# Patient Record
Sex: Male | Born: 1980
Health system: Southern US, Community
[De-identification: ages and names within clinical notes are randomized; demographics above are authoritative.]

---

## 2015-11-24 DIAGNOSIS — N509 Disorder of male genital organs, unspecified: Secondary | ICD-10-CM | POA: Diagnosis not present

## 2015-11-24 DIAGNOSIS — Z6825 Body mass index (BMI) 25.0-25.9, adult: Secondary | ICD-10-CM | POA: Diagnosis not present

## 2015-11-30 DIAGNOSIS — N448 Other noninflammatory disorders of the testis: Secondary | ICD-10-CM | POA: Diagnosis not present

## 2016-01-12 DIAGNOSIS — N448 Other noninflammatory disorders of the testis: Secondary | ICD-10-CM | POA: Diagnosis not present

## 2016-03-31 DIAGNOSIS — H109 Unspecified conjunctivitis: Secondary | ICD-10-CM | POA: Diagnosis not present

## 2016-03-31 DIAGNOSIS — H579 Unspecified disorder of eye and adnexa: Secondary | ICD-10-CM | POA: Diagnosis not present

## 2016-10-04 ENCOUNTER — Encounter (INDEPENDENT_AMBULATORY_CARE_PROVIDER_SITE_OTHER): Payer: Self-pay | Admitting: *Deleted

## 2016-10-04 VITALS — BP 131/77 | HR 69 | Temp 98.4°F | Ht 73.0 in | Wt 199.5 lb

## 2016-10-04 DIAGNOSIS — Z006 Encounter for examination for normal comparison and control in clinical research program: Secondary | ICD-10-CM

## 2016-10-04 LAB — COMPREHENSIVE METABOLIC PANEL
ALBUMIN: 3.8 g/dL (ref 3.6–5.1)
ALK PHOS: 43 U/L (ref 40–115)
ALT: 21 U/L (ref 9–46)
AST: 26 U/L (ref 10–40)
BILIRUBIN TOTAL: 0.7 mg/dL (ref 0.2–1.2)
BUN: 15 mg/dL (ref 7–25)
CALCIUM: 9.1 mg/dL (ref 8.6–10.3)
CO2: 24 mmol/L (ref 20–31)
CREATININE: 1.08 mg/dL (ref 0.60–1.35)
Chloride: 107 mmol/L (ref 98–110)
Glucose, Bld: 75 mg/dL (ref 65–99)
Potassium: 4.6 mmol/L (ref 3.5–5.3)
Sodium: 141 mmol/L (ref 135–146)
TOTAL PROTEIN: 6.4 g/dL (ref 6.1–8.1)

## 2016-10-04 LAB — CBC WITH DIFFERENTIAL/PLATELET
Basophils Absolute: 61 cells/uL (ref 0–200)
Basophils Relative: 1 %
EOS PCT: 1 %
Eosinophils Absolute: 61 cells/uL (ref 15–500)
HEMATOCRIT: 45.8 % (ref 38.5–50.0)
HEMOGLOBIN: 15.6 g/dL (ref 13.2–17.1)
LYMPHS ABS: 2257 {cells}/uL (ref 850–3900)
Lymphocytes Relative: 37 %
MCH: 31 pg (ref 27.0–33.0)
MCHC: 34.1 g/dL (ref 32.0–36.0)
MCV: 91.1 fL (ref 80.0–100.0)
MONO ABS: 427 {cells}/uL (ref 200–950)
MPV: 12.4 fL (ref 7.5–12.5)
Monocytes Relative: 7 %
NEUTROS ABS: 3294 {cells}/uL (ref 1500–7800)
Neutrophils Relative %: 54 %
Platelets: 188 10*3/uL (ref 140–400)
RBC: 5.03 MIL/uL (ref 4.20–5.80)
RDW: 14.1 % (ref 11.0–15.0)
WBC: 6.1 10*3/uL (ref 3.8–10.8)

## 2016-10-04 NOTE — Progress Notes (Signed)
   Subjective:    Patient ID: Michael Duncan, male    DOB: 05-15-1981, 36 y.o.   MRN: [id number]  HPI Here for PE for entry into HPTN study.   History reviewed.    Review of Systems     Objective:   Physical Exam  Constitutional: He appears well-developed and well-nourished. No distress.  HENT:  Mouth/Throat: No oropharyngeal exudate.  Eyes: No scleral icterus.  Cardiovascular: Normal rate, regular rhythm and normal heart sounds.   No murmur heard. Pulmonary/Chest: Effort normal and breath sounds normal. No respiratory distress.  Abdominal: Soft. Bowel sounds are normal. He exhibits no distension. There is no tenderness.  Musculoskeletal: He exhibits no edema.  Lymphadenopathy:    He has no cervical adenopathy.  Neurological: He is alert.  Skin: No rash noted.  Psychiatric: He has a normal mood and affect.          Assessment & Plan:

## 2016-10-05 LAB — HEPATITIS C ANTIBODY: HCV AB: NEGATIVE

## 2016-10-05 LAB — HEPATITIS B SURFACE ANTIGEN: HEP B S AG: NEGATIVE

## 2016-10-05 LAB — HIV ANTIBODY (ROUTINE TESTING W REFLEX): HIV: NONREACTIVE

## 2016-10-06 ENCOUNTER — Encounter: Payer: Self-pay | Admitting: *Deleted

## 2016-10-06 LAB — HIV-1 RNA QUANT-NO REFLEX-BLD
HIV 1 RNA Quant: <20 copies/mL
HIV-1 RNA QUANT, LOG: NOT DETECTED {Log_copies}/mL

## 2016-10-06 NOTE — Progress Notes (Signed)
Study: A Phase 2b/3 Double Blind Safety and Efficacy Study of Injectable Cabotegravir compared to Daily Oral Tenofovir Disoproxil Fumarate/Emtricitabine (TDF/FTC), For Pre-Exposure Prophylaxis in HIV-Uninfected Cisgender Men and Transgender Women who have sex with Men.  Medication: Investigational Injectable Cabotegravir/placebo compared to Truvada/placebo. Duration: Around 4 years.  Michael Duncan is here for HPTN screening visit. After verifying the correct version I explained/reviewed the informed consent in the language that he understood. Risk, benefits, responsibilities, and other options were reviewed. I answered his questions. Comprehension was assessed. He was given adequate time to consider his options. He verbalized understanding and signed the consent witnessed by me. I then gave him a copy of the consent. SexPro = 15. He has had 60 male at birth sexual partners in the past 6 mos and wears condoms 95% of the time.  He is mainly the insertive partner but is receptive at times. In January 2018 he tested + and treated for Gonorrhea at [facility].  HIV counseling was given including description of the testing and how it is done; explained HIV and how it is spread and ways to prevent it; Discussed the meaning of the possible test results and what impact the test results may have on the participant. PTID assigned. Blood drawn and HIV rapid confirmed to be non-reactive. Medical history, medications, bleeding history, and signs/symptoms were reviewed. ECG and vitals were obtained. QTcB = 409 ms.  Complete PE performed. He received $50 gift card for screening visit. If deemed eligible and he is willing to participant in the study then anticipated entry visit is scheduled for 10/12/2016.

## 2016-10-12 ENCOUNTER — Encounter (INDEPENDENT_AMBULATORY_CARE_PROVIDER_SITE_OTHER): Payer: Self-pay | Admitting: *Deleted

## 2016-10-12 VITALS — BP 119/79 | HR 70 | Temp 98.3°F | Wt 199.5 lb

## 2016-10-12 DIAGNOSIS — Z006 Encounter for examination for normal comparison and control in clinical research program: Secondary | ICD-10-CM

## 2016-10-12 LAB — URINALYSIS
Bilirubin Urine: NEGATIVE
GLUCOSE, UA: NEGATIVE
HGB URINE DIPSTICK: NEGATIVE
KETONES UR: NEGATIVE
Leukocytes, UA: NEGATIVE
NITRITE: NEGATIVE
PH: 6.5 (ref 5.0–8.0)
PROTEIN: NEGATIVE
Specific Gravity, Urine: 1.016 (ref 1.001–1.035)

## 2016-10-12 LAB — COMPREHENSIVE METABOLIC PANEL
ALBUMIN: 3.9 g/dL (ref 3.6–5.1)
ALT: 22 U/L (ref 9–46)
AST: 25 U/L (ref 10–40)
Alkaline Phosphatase: 37 U/L — ABNORMAL LOW (ref 40–115)
BUN: 15 mg/dL (ref 7–25)
CHLORIDE: 107 mmol/L (ref 98–110)
CO2: 25 mmol/L (ref 20–31)
CREATININE: 1.04 mg/dL (ref 0.60–1.35)
Calcium: 9 mg/dL (ref 8.6–10.3)
GLUCOSE: 96 mg/dL (ref 65–99)
Potassium: 4.4 mmol/L (ref 3.5–5.3)
SODIUM: 138 mmol/L (ref 135–146)
Total Bilirubin: 0.7 mg/dL (ref 0.2–1.2)
Total Protein: 6.4 g/dL (ref 6.1–8.1)

## 2016-10-12 LAB — CBC WITH DIFFERENTIAL/PLATELET
BASOS PCT: 1 %
Basophils Absolute: 60 cells/uL (ref 0–200)
EOS PCT: 2 %
Eosinophils Absolute: 120 cells/uL (ref 15–500)
HEMATOCRIT: 45.2 % (ref 38.5–50.0)
Hemoglobin: 15.7 g/dL (ref 13.2–17.1)
LYMPHS PCT: 42 %
Lymphs Abs: 2520 cells/uL (ref 850–3900)
MCH: 31.3 pg (ref 27.0–33.0)
MCHC: 34.7 g/dL (ref 32.0–36.0)
MCV: 90.2 fL (ref 80.0–100.0)
MONOS PCT: 6 %
MPV: 12.5 fL (ref 7.5–12.5)
Monocytes Absolute: 360 cells/uL (ref 200–950)
NEUTROS PCT: 49 %
Neutro Abs: 2940 cells/uL (ref 1500–7800)
PLATELETS: 176 10*3/uL (ref 140–400)
RBC: 5.01 MIL/uL (ref 4.20–5.80)
RDW: 13.7 % (ref 11.0–15.0)
WBC: 6 10*3/uL (ref 3.8–10.8)

## 2016-10-12 LAB — HEPATITIS B CORE ANTIBODY, TOTAL: HEP B C TOTAL AB: NONREACTIVE

## 2016-10-12 LAB — HEPATITIS B SURFACE ANTIBODY,QUALITATIVE: HEP B S AB: POSITIVE — AB

## 2016-10-12 LAB — LIPID PANEL
Cholesterol: 148 mg/dL (ref <200)
HDL: 48 mg/dL (ref >40)
LDL CALC: 87 mg/dL (ref <100)
Total CHOL/HDL Ratio: 3.1 Ratio (ref <5.0)
Triglycerides: 64 mg/dL (ref <150)
VLDL: 13 mg/dL (ref <30)

## 2016-10-12 LAB — PHOSPHORUS: PHOSPHORUS: 3.1 mg/dL (ref 2.5–4.5)

## 2016-10-12 LAB — AMYLASE: AMYLASE: 54 U/L (ref 0–105)

## 2016-10-12 LAB — LIPASE: Lipase: 19 U/L (ref 7–60)

## 2016-10-12 LAB — CK: Total CK: 136 U/L (ref 7–232)

## 2016-10-13 LAB — HIV ANTIBODY (ROUTINE TESTING W REFLEX): HIV: NONREACTIVE

## 2016-10-13 LAB — GC/CHLAMYDIA PROBE AMP
CT PROBE, AMP APTIMA: NOT DETECTED
GC Probe RNA: NOT DETECTED

## 2016-10-13 LAB — RPR

## 2016-10-13 NOTE — Progress Notes (Signed)
Study: A Phase 2b/3 Double Blind Safety and Efficacy Study of Injectable Cabotegravir compared to Daily Oral Tenofovir Disoproxil Fumarate/Emtricitabine (TDF/FTC), For Pre-Exposure Prophylaxis in HIV-Uninfected Cisgender Men and Transgender Women who have sex with Men.  Medication: Investigational Injectable Cabotegravir/placebo compared to Truvada/placebo. Duration: Around 4 years  Michael Duncan is here for entry visit. After confirming his willingness to enroll onto study I drew his blood. HIV rapid confirmed to be non-reactive. He denies any new issues since last study visit. Questionnaires completed. We discussed administration of oral study products, potential side effects, and gave him my contact information should he have any questions/concerns. He verbalized understanding. Oral study product dispensed. He will return in 2 weeks for a safety check and adherence assessment.

## 2016-10-15 LAB — CT/NG RNA, TMA RECTAL
CHLAMYDIA TRACHOMATIS RNA: NOT DETECTED
Neisseria Gonorrhoeae RNA: NOT DETECTED

## 2016-10-26 ENCOUNTER — Encounter (INDEPENDENT_AMBULATORY_CARE_PROVIDER_SITE_OTHER): Payer: Self-pay | Admitting: *Deleted

## 2016-10-26 VITALS — BP 113/70 | HR 66 | Temp 98.4°F | Wt 204.5 lb

## 2016-10-26 DIAGNOSIS — Z006 Encounter for examination for normal comparison and control in clinical research program: Secondary | ICD-10-CM

## 2016-10-26 LAB — COMPREHENSIVE METABOLIC PANEL
ALT: 29 U/L (ref 9–46)
AST: 31 U/L (ref 10–40)
Albumin: 3.9 g/dL (ref 3.6–5.1)
Alkaline Phosphatase: 38 U/L — ABNORMAL LOW (ref 40–115)
BILIRUBIN TOTAL: 0.8 mg/dL (ref 0.2–1.2)
BUN: 14 mg/dL (ref 7–25)
CO2: 27 mmol/L (ref 20–31)
CREATININE: 1.01 mg/dL (ref 0.60–1.35)
Calcium: 8.7 mg/dL (ref 8.6–10.3)
Chloride: 107 mmol/L (ref 98–110)
GLUCOSE: 100 mg/dL — AB (ref 65–99)
Potassium: 4.3 mmol/L (ref 3.5–5.3)
SODIUM: 140 mmol/L (ref 135–146)
Total Protein: 6.4 g/dL (ref 6.1–8.1)

## 2016-10-26 LAB — CBC WITH DIFFERENTIAL/PLATELET
BASOS ABS: 63 {cells}/uL (ref 0–200)
BASOS PCT: 1 %
EOS ABS: 126 {cells}/uL (ref 15–500)
Eosinophils Relative: 2 %
HEMATOCRIT: 42.2 % (ref 38.5–50.0)
HEMOGLOBIN: 14.8 g/dL (ref 13.2–17.1)
LYMPHS ABS: 2142 {cells}/uL (ref 850–3900)
Lymphocytes Relative: 34 %
MCH: 31.3 pg (ref 27.0–33.0)
MCHC: 35.1 g/dL (ref 32.0–36.0)
MCV: 89.2 fL (ref 80.0–100.0)
MONO ABS: 378 {cells}/uL (ref 200–950)
MPV: 11.9 fL (ref 7.5–12.5)
Monocytes Relative: 6 %
NEUTROS ABS: 3591 {cells}/uL (ref 1500–7800)
Neutrophils Relative %: 57 %
PLATELETS: 167 10*3/uL (ref 140–400)
RBC: 4.73 MIL/uL (ref 4.20–5.80)
RDW: 13.6 % (ref 11.0–15.0)
WBC: 6.3 10*3/uL (ref 3.8–10.8)

## 2016-10-26 LAB — AMYLASE: Amylase: 52 U/L (ref 0–105)

## 2016-10-26 LAB — LIPASE: Lipase: 20 U/L (ref 7–60)

## 2016-10-26 LAB — CK: Total CK: 201 U/L (ref 7–232)

## 2016-10-26 LAB — PHOSPHORUS: Phosphorus: 3.4 mg/dL (ref 2.5–4.5)

## 2016-10-26 NOTE — Progress Notes (Signed)
Study: A Phase 2b/3 Double Blind Safety and Efficacy Study of Injectable Cabotegravir compared to Daily Oral Tenofovir Disoproxil Fumarate/Emtricitabine (TDF/FTC), For Pre-Exposure Prophylaxis in HIV-Uninfected Cisgender Men and Transgender Women who have sex with Men.  Medication: Investigational Injectable Cabotegravir/placebo compared to Truvada/placebo. Duration: Around 4 years.  Michael RuizJohn is here for week 2 visit. Verbalized excellent adherence with his study medication. Denies any missed doses. No new complaints or concerns verbalized. Next visit scheduled for 5/15 @ 9:30am.

## 2016-10-27 LAB — HIV ANTIBODY (ROUTINE TESTING W REFLEX): HIV 1&2 Ab, 4th Generation: NONREACTIVE

## 2016-11-07 ENCOUNTER — Encounter (INDEPENDENT_AMBULATORY_CARE_PROVIDER_SITE_OTHER): Payer: Self-pay | Admitting: *Deleted

## 2016-11-07 VITALS — BP 108/72 | HR 61 | Temp 98.1°F | Wt 198.2 lb

## 2016-11-07 DIAGNOSIS — Z006 Encounter for examination for normal comparison and control in clinical research program: Secondary | ICD-10-CM

## 2016-11-07 LAB — CBC WITH DIFFERENTIAL/PLATELET
BASOS ABS: 0 {cells}/uL (ref 0–200)
Basophils Relative: 0 %
EOS ABS: 116 {cells}/uL (ref 15–500)
Eosinophils Relative: 2 %
HCT: 41.7 % (ref 38.5–50.0)
HEMOGLOBIN: 14.6 g/dL (ref 13.2–17.1)
Lymphocytes Relative: 40 %
Lymphs Abs: 2320 cells/uL (ref 850–3900)
MCH: 31.5 pg (ref 27.0–33.0)
MCHC: 35 g/dL (ref 32.0–36.0)
MCV: 89.9 fL (ref 80.0–100.0)
MPV: 12.1 fL (ref 7.5–12.5)
Monocytes Absolute: 464 cells/uL (ref 200–950)
Monocytes Relative: 8 %
NEUTROS ABS: 2900 {cells}/uL (ref 1500–7800)
NEUTROS PCT: 50 %
Platelets: 174 10*3/uL (ref 140–400)
RBC: 4.64 MIL/uL (ref 4.20–5.80)
RDW: 14.3 % (ref 11.0–15.0)
WBC: 5.8 10*3/uL (ref 3.8–10.8)

## 2016-11-07 LAB — COMPREHENSIVE METABOLIC PANEL
ALBUMIN: 4 g/dL (ref 3.6–5.1)
ALT: 38 U/L (ref 9–46)
AST: 49 U/L — ABNORMAL HIGH (ref 10–40)
Alkaline Phosphatase: 39 U/L — ABNORMAL LOW (ref 40–115)
BUN: 15 mg/dL (ref 7–25)
CHLORIDE: 106 mmol/L (ref 98–110)
CO2: 26 mmol/L (ref 20–31)
Calcium: 9.2 mg/dL (ref 8.6–10.3)
Creat: 1.08 mg/dL (ref 0.60–1.35)
Glucose, Bld: 85 mg/dL (ref 65–99)
POTASSIUM: 4.8 mmol/L (ref 3.5–5.3)
Sodium: 139 mmol/L (ref 135–146)
TOTAL PROTEIN: 6.5 g/dL (ref 6.1–8.1)
Total Bilirubin: 1 mg/dL (ref 0.2–1.2)

## 2016-11-07 LAB — HIV ANTIBODY (ROUTINE TESTING W REFLEX): HIV: NONREACTIVE

## 2016-11-07 LAB — LIPASE: Lipase: 23 U/L (ref 7–60)

## 2016-11-07 LAB — PHOSPHORUS: PHOSPHORUS: 3.6 mg/dL (ref 2.5–4.5)

## 2016-11-07 LAB — AMYLASE: AMYLASE: 47 U/L (ref 21–101)

## 2016-11-07 LAB — CK: Total CK: 760 U/L — ABNORMAL HIGH (ref 44–196)

## 2016-11-07 NOTE — Progress Notes (Signed)
Michael RuizJohn is here for his week 4 visit for HPTN. His adherence is at 100% and denies any problems with taking the meds or any side effects. We discussed what will happen at the next visit (injection and questionnaires). He will return next Thursday for week 5.

## 2016-11-16 ENCOUNTER — Encounter (INDEPENDENT_AMBULATORY_CARE_PROVIDER_SITE_OTHER): Payer: Self-pay | Admitting: *Deleted

## 2016-11-16 VITALS — BP 128/79 | HR 84 | Temp 98.4°F | Wt 196.0 lb

## 2016-11-16 DIAGNOSIS — Z006 Encounter for examination for normal comparison and control in clinical research program: Secondary | ICD-10-CM

## 2016-11-16 LAB — HIV ANTIBODY (ROUTINE TESTING W REFLEX): HIV: NONREACTIVE

## 2016-11-16 NOTE — Progress Notes (Signed)
Study: A Phase 2b/3 Double Blind Safety and Efficacy Study of Injectable Cabotegravir compared to Daily Oral Tenofovir Disoproxil Fumarate/Emtricitabine (TDF/FTC), For Pre-Exposure Prophylaxis in HIV-Uninfected Cisgender Men and Transgender Women who have sex with Men.  Medication: Investigational Injectable Cabotegravir/placebo compared to Truvada/placebo. Duration: Around 4 years.  Michael RuizJohn is here for week 5. He denies any changes since last study visit. He returned #24 pills of each oral study product. Last time he took oral study products was this morning at 8:30am. Blood drawn and HIV rapid confirmed non-reactive. He received injection in right gluteal muscle with no problems. Oral study product dispensed. He received $50 giftcard for visit. Next appointment schedule for 5/31.

## 2016-11-23 ENCOUNTER — Encounter (INDEPENDENT_AMBULATORY_CARE_PROVIDER_SITE_OTHER): Payer: Self-pay | Admitting: *Deleted

## 2016-11-23 VITALS — BP 119/69 | HR 67 | Temp 99.4°F | Wt 194.2 lb

## 2016-11-23 DIAGNOSIS — Z006 Encounter for examination for normal comparison and control in clinical research program: Secondary | ICD-10-CM

## 2016-11-23 LAB — CBC WITH DIFFERENTIAL/PLATELET
Basophils Absolute: 68 cells/uL (ref 0–200)
Basophils Relative: 1 %
EOS PCT: 1 %
Eosinophils Absolute: 68 cells/uL (ref 15–500)
HCT: 40.1 % (ref 38.5–50.0)
Hemoglobin: 13.8 g/dL (ref 13.2–17.1)
Lymphocytes Relative: 31 %
Lymphs Abs: 2108 cells/uL (ref 850–3900)
MCH: 31.4 pg (ref 27.0–33.0)
MCHC: 34.4 g/dL (ref 32.0–36.0)
MCV: 91.3 fL (ref 80.0–100.0)
MONOS PCT: 8 %
MPV: 11.9 fL (ref 7.5–12.5)
Monocytes Absolute: 544 cells/uL (ref 200–950)
NEUTROS ABS: 4012 {cells}/uL (ref 1500–7800)
Neutrophils Relative %: 59 %
PLATELETS: 185 10*3/uL (ref 140–400)
RBC: 4.39 MIL/uL (ref 4.20–5.80)
RDW: 15 % (ref 11.0–15.0)
WBC: 6.8 10*3/uL (ref 3.8–10.8)

## 2016-11-23 LAB — COMPREHENSIVE METABOLIC PANEL
ALK PHOS: 53 U/L (ref 40–115)
ALT: 33 U/L (ref 9–46)
AST: 47 U/L — AB (ref 10–40)
Albumin: 3.8 g/dL (ref 3.6–5.1)
BUN: 25 mg/dL (ref 7–25)
CALCIUM: 8.8 mg/dL (ref 8.6–10.3)
CO2: 23 mmol/L (ref 20–31)
Chloride: 107 mmol/L (ref 98–110)
Creat: 1.1 mg/dL (ref 0.60–1.35)
GLUCOSE: 86 mg/dL (ref 65–99)
POTASSIUM: 4.4 mmol/L (ref 3.5–5.3)
Sodium: 139 mmol/L (ref 135–146)
Total Bilirubin: 0.8 mg/dL (ref 0.2–1.2)
Total Protein: 6.2 g/dL (ref 6.1–8.1)

## 2016-11-23 LAB — CK: CK TOTAL: 776 U/L — AB (ref 44–196)

## 2016-11-23 LAB — AMYLASE: AMYLASE: 42 U/L (ref 21–101)

## 2016-11-23 LAB — PHOSPHORUS: PHOSPHORUS: 3.5 mg/dL (ref 2.5–4.5)

## 2016-11-23 LAB — HIV ANTIBODY (ROUTINE TESTING W REFLEX): HIV 1&2 Ab, 4th Generation: NONREACTIVE

## 2016-11-23 LAB — LIPASE: Lipase: 19 U/L (ref 7–60)

## 2016-11-23 NOTE — Progress Notes (Signed)
Study: A Phase 2b/3 Double Blind Safety and Efficacy Study of Injectable Cabotegravir compared to Daily Oral Tenofovir Disoproxil Fumarate/Emtricitabine (TDF/FTC), For Pre-Exposure Prophylaxis in HIV-Uninfected Cisgender Men and Transgender Women who have sex with Men.  Medication: Investigational Injectable Cabotegravir/placebo compared to Truvada/placebo. Duration: Around 4 years.  Michael RuizJohn is here for week 6. He denies any injection site reaction and/or any other changes since last stud visit. Questionnaire completed. He was introduced to [employer]ridget Land(outreach coordinator). Blood drawn with no problems. Vitals stable. He received $50 gift card. Next appointment scheduled for Monday 6/18.

## 2016-12-11 ENCOUNTER — Encounter (INDEPENDENT_AMBULATORY_CARE_PROVIDER_SITE_OTHER): Payer: Self-pay | Admitting: *Deleted

## 2016-12-11 VITALS — BP 114/69 | HR 56 | Temp 98.0°F | Wt 191.5 lb

## 2016-12-11 DIAGNOSIS — Z006 Encounter for examination for normal comparison and control in clinical research program: Secondary | ICD-10-CM

## 2016-12-11 LAB — CBC WITH DIFFERENTIAL/PLATELET
Basophils Absolute: 48 {cells}/uL (ref 0–200)
Basophils Relative: 1 %
Eosinophils Absolute: 144 {cells}/uL (ref 15–500)
Eosinophils Relative: 3 %
HCT: 41 % (ref 38.5–50.0)
Hemoglobin: 13.9 g/dL (ref 13.2–17.1)
Lymphocytes Relative: 40 %
Lymphs Abs: 1920 {cells}/uL (ref 850–3900)
MCH: 31.9 pg (ref 27.0–33.0)
MCHC: 33.9 g/dL (ref 32.0–36.0)
MCV: 94 fL (ref 80.0–100.0)
MPV: 11.7 fL (ref 7.5–12.5)
Monocytes Absolute: 240 {cells}/uL (ref 200–950)
Monocytes Relative: 5 %
Neutro Abs: 2448 {cells}/uL (ref 1500–7800)
Neutrophils Relative %: 51 %
Platelets: 198 K/uL (ref 140–400)
RBC: 4.36 MIL/uL (ref 4.20–5.80)
RDW: 14.7 % (ref 11.0–15.0)
WBC: 4.8 K/uL (ref 3.8–10.8)

## 2016-12-11 LAB — COMPREHENSIVE METABOLIC PANEL
ALK PHOS: 55 U/L (ref 40–115)
ALT: 32 U/L (ref 9–46)
AST: 35 U/L (ref 10–40)
Albumin: 3.8 g/dL (ref 3.6–5.1)
BUN: 15 mg/dL (ref 7–25)
CALCIUM: 8.9 mg/dL (ref 8.6–10.3)
CO2: 24 mmol/L (ref 20–31)
Chloride: 107 mmol/L (ref 98–110)
Creat: 0.92 mg/dL (ref 0.60–1.35)
Glucose, Bld: 92 mg/dL (ref 65–99)
POTASSIUM: 4.7 mmol/L (ref 3.5–5.3)
Sodium: 139 mmol/L (ref 135–146)
TOTAL PROTEIN: 6.6 g/dL (ref 6.1–8.1)
Total Bilirubin: 0.6 mg/dL (ref 0.2–1.2)

## 2016-12-11 LAB — PHOSPHORUS: PHOSPHORUS: 2.8 mg/dL (ref 2.5–4.5)

## 2016-12-11 LAB — AMYLASE: AMYLASE: 55 U/L (ref 21–101)

## 2016-12-11 LAB — CK: CK TOTAL: 260 U/L — AB (ref 44–196)

## 2016-12-11 LAB — LIPASE: LIPASE: 27 U/L (ref 7–60)

## 2016-12-11 NOTE — Progress Notes (Signed)
Study: A Phase 2b/3 Double Blind Safety and Efficacy Study of Injectable Cabotegravir compared to Daily Oral Tenofovir Disoproxil Fumarate/Emtricitabine (TDF/FTC), For Pre-Exposure Prophylaxis in HIV-Uninfected Cisgender Men and Transgender Women who have sex with Men.  Medication: Investigational Injectable Cabotegravir/placebo compared to Truvada/placebo. Duration: Around 4 years.  Michael RuizJohn is here for week 9 visit. No new complaints or concerns verbalized. States excellent adherence with his oral study medication. Rapid HIV non-reactive. Cabotegravir/placebo injection given (L) gluteal muscle. Site unremarkable. Next visit scheduled for 6/26 @ 10:30am.

## 2016-12-11 NOTE — Progress Notes (Signed)
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 Ppt prefers email vs snail mail; confirmed email address. Ppt currently uses mychart and has the [employer] app on cell phone. Ppt receives multiple appt reminders.

## 2016-12-12 LAB — HIV ANTIBODY (ROUTINE TESTING W REFLEX): HIV: NONREACTIVE

## 2016-12-19 ENCOUNTER — Encounter (INDEPENDENT_AMBULATORY_CARE_PROVIDER_SITE_OTHER): Payer: Self-pay | Admitting: *Deleted

## 2016-12-19 VITALS — BP 114/69 | HR 62 | Temp 98.5°F | Wt 190.8 lb

## 2016-12-19 DIAGNOSIS — Z006 Encounter for examination for normal comparison and control in clinical research program: Secondary | ICD-10-CM

## 2016-12-19 LAB — COMPREHENSIVE METABOLIC PANEL
ALBUMIN: 3.6 g/dL (ref 3.6–5.1)
ALT: 28 U/L (ref 9–46)
AST: 35 U/L (ref 10–40)
Alkaline Phosphatase: 54 U/L (ref 40–115)
BILIRUBIN TOTAL: 0.5 mg/dL (ref 0.2–1.2)
BUN: 16 mg/dL (ref 7–25)
CO2: 26 mmol/L (ref 20–31)
CREATININE: 1 mg/dL (ref 0.60–1.35)
Calcium: 8.8 mg/dL (ref 8.6–10.3)
Chloride: 106 mmol/L (ref 98–110)
Glucose, Bld: 87 mg/dL (ref 65–99)
Potassium: 4.4 mmol/L (ref 3.5–5.3)
SODIUM: 138 mmol/L (ref 135–146)
Total Protein: 6 g/dL — ABNORMAL LOW (ref 6.1–8.1)

## 2016-12-19 LAB — CBC WITH DIFFERENTIAL/PLATELET
BASOS PCT: 1 %
Basophils Absolute: 31 cells/uL (ref 0–200)
EOS PCT: 0 %
Eosinophils Absolute: 0 cells/uL — ABNORMAL LOW (ref 15–500)
HCT: 39 % (ref 38.5–50.0)
HEMOGLOBIN: 13 g/dL — AB (ref 13.2–17.1)
LYMPHS ABS: 1302 {cells}/uL (ref 850–3900)
Lymphocytes Relative: 42 %
MCH: 30.6 pg (ref 27.0–33.0)
MCHC: 33.3 g/dL (ref 32.0–36.0)
MCV: 91.8 fL (ref 80.0–100.0)
MPV: 11.6 fL (ref 7.5–12.5)
Monocytes Absolute: 434 cells/uL (ref 200–950)
Monocytes Relative: 14 %
NEUTROS ABS: 1333 {cells}/uL — AB (ref 1500–7800)
NEUTROS PCT: 43 %
Platelets: 151 10*3/uL (ref 140–400)
RBC: 4.25 MIL/uL (ref 4.20–5.80)
RDW: 14 % (ref 11.0–15.0)
WBC: 3.1 10*3/uL — AB (ref 3.8–10.8)

## 2016-12-19 LAB — PHOSPHORUS: Phosphorus: 2.8 mg/dL (ref 2.5–4.5)

## 2016-12-19 NOTE — Progress Notes (Signed)
Study: A Phase 2b/3 Double Blind Safety and Efficacy Study of Injectable Cabotegravir compared to Daily Oral Tenofovir Disoproxil Fumarate/Emtricitabine (TDF/FTC), For Pre-Exposure Prophylaxis in HIV-Uninfected Cisgender Men and Transgender Women who have sex with Men.  Medication: Investigational Injectable Cabotegravir/placebo compared to Truvada/placebo. Duration: Around 4 years.  Michael RuizJohn is here for week 10 visit. Denied any injection site reaction after his last visit. Verbalized excellent adherence with his oral medication. No missed doses. Rapid HIV non-reactive. Declined condoms/lube today. He will return in August for his next injection visit.

## 2016-12-20 LAB — CK: Total CK: 338 U/L — ABNORMAL HIGH (ref 44–196)

## 2016-12-20 LAB — AMYLASE: Amylase: 44 U/L (ref 21–101)

## 2016-12-20 LAB — LIPASE: LIPASE: 22 U/L (ref 7–60)

## 2016-12-20 LAB — HIV ANTIBODY (ROUTINE TESTING W REFLEX): HIV: NONREACTIVE

## 2017-01-29 DIAGNOSIS — M2550 Pain in unspecified joint: Secondary | ICD-10-CM | POA: Diagnosis not present

## 2017-01-29 DIAGNOSIS — R509 Fever, unspecified: Secondary | ICD-10-CM | POA: Diagnosis not present

## 2017-01-29 DIAGNOSIS — Z6824 Body mass index (BMI) 24.0-24.9, adult: Secondary | ICD-10-CM | POA: Diagnosis not present

## 2017-02-08 ENCOUNTER — Encounter (INDEPENDENT_AMBULATORY_CARE_PROVIDER_SITE_OTHER): Payer: Self-pay | Admitting: *Deleted

## 2017-02-08 VITALS — BP 114/67 | HR 64 | Temp 98.2°F | Wt 185.5 lb

## 2017-02-08 DIAGNOSIS — Z006 Encounter for examination for normal comparison and control in clinical research program: Secondary | ICD-10-CM

## 2017-02-08 LAB — CK: CK TOTAL: 266 U/L — AB (ref 44–196)

## 2017-02-08 LAB — COMPREHENSIVE METABOLIC PANEL
ALK PHOS: 53 U/L (ref 40–115)
ALT: 52 U/L — ABNORMAL HIGH (ref 9–46)
AST: 38 U/L (ref 10–40)
Albumin: 3.7 g/dL (ref 3.6–5.1)
BILIRUBIN TOTAL: 0.5 mg/dL (ref 0.2–1.2)
BUN: 21 mg/dL (ref 7–25)
CO2: 25 mmol/L (ref 20–32)
CREATININE: 0.96 mg/dL (ref 0.60–1.35)
Calcium: 8.7 mg/dL (ref 8.6–10.3)
Chloride: 108 mmol/L (ref 98–110)
Glucose, Bld: 96 mg/dL (ref 65–99)
Potassium: 4.8 mmol/L (ref 3.5–5.3)
SODIUM: 139 mmol/L (ref 135–146)
Total Protein: 6.3 g/dL (ref 6.1–8.1)

## 2017-02-08 LAB — CBC WITH DIFFERENTIAL/PLATELET
Basophils Absolute: 51 cells/uL (ref 0–200)
Basophils Relative: 1 %
EOS PCT: 2 %
Eosinophils Absolute: 102 cells/uL (ref 15–500)
HCT: 39.2 % (ref 38.5–50.0)
Hemoglobin: 13.3 g/dL (ref 13.2–17.1)
LYMPHS ABS: 2295 {cells}/uL (ref 850–3900)
LYMPHS PCT: 45 %
MCH: 30.5 pg (ref 27.0–33.0)
MCHC: 33.9 g/dL (ref 32.0–36.0)
MCV: 89.9 fL (ref 80.0–100.0)
MPV: 11.2 fL (ref 7.5–12.5)
Monocytes Absolute: 408 cells/uL (ref 200–950)
Monocytes Relative: 8 %
NEUTROS PCT: 44 %
Neutro Abs: 2244 cells/uL (ref 1500–7800)
Platelets: 207 10*3/uL (ref 140–400)
RBC: 4.36 MIL/uL (ref 4.20–5.80)
RDW: 13.4 % (ref 11.0–15.0)
WBC: 5.1 10*3/uL (ref 3.8–10.8)

## 2017-02-08 LAB — AMYLASE: AMYLASE: 55 U/L (ref 21–101)

## 2017-02-08 LAB — LIPASE: Lipase: 21 U/L (ref 7–60)

## 2017-02-08 LAB — PHOSPHORUS: Phosphorus: 2.8 mg/dL (ref 2.5–4.5)

## 2017-02-08 NOTE — Progress Notes (Signed)
Study: A Phase 2b/3 Double Blind Safety and Efficacy Study of Injectable Cabotegravir compared to Daily Oral Tenofovir Disoproxil Fumarate/Emtricitabine (TDF/FTC), For Pre-Exposure Prophylaxis in HIV-Uninfected Cisgender Men and Transgender Women who have sex with Men.  Medication: Investigational Injectable Cabotegravir/placebo compared to Truvada/placebo. Duration: Around 4 years.  Michael RuizJohn is here for week 17 visit. [facility]), joint aches, loss appetite and headache which lasted 3 days. 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 Denies any current symptoms at this time. Excellent adherence with his study medication. Rapid HIV non-reactive. Cabotegravir/placebo injection given (R) gluteal muscle. Site unremarkable. Next visit scheduled for 02/22/17 at 10:30am.

## 2017-02-09 LAB — HIV ANTIBODY (ROUTINE TESTING W REFLEX): HIV: NONREACTIVE

## 2017-02-22 ENCOUNTER — Encounter (INDEPENDENT_AMBULATORY_CARE_PROVIDER_SITE_OTHER): Payer: Self-pay | Admitting: *Deleted

## 2017-02-22 VITALS — BP 107/66 | HR 61 | Temp 98.7°F | Wt 186.5 lb

## 2017-02-22 DIAGNOSIS — Z006 Encounter for examination for normal comparison and control in clinical research program: Secondary | ICD-10-CM

## 2017-02-22 LAB — CBC WITH DIFFERENTIAL/PLATELET
BASOS ABS: 48 {cells}/uL (ref 0–200)
BASOS PCT: 1 %
EOS ABS: 96 {cells}/uL (ref 15–500)
Eosinophils Relative: 2 %
HEMATOCRIT: 39.4 % (ref 38.5–50.0)
Hemoglobin: 13.2 g/dL (ref 13.2–17.1)
LYMPHS ABS: 2208 {cells}/uL (ref 850–3900)
LYMPHS PCT: 46 %
MCH: 30 pg (ref 27.0–33.0)
MCHC: 33.5 g/dL (ref 32.0–36.0)
MCV: 89.5 fL (ref 80.0–100.0)
MPV: 11.9 fL (ref 7.5–12.5)
Monocytes Absolute: 384 cells/uL (ref 200–950)
Monocytes Relative: 8 %
Neutro Abs: 2064 cells/uL (ref 1500–7800)
Neutrophils Relative %: 43 %
Platelets: 168 10*3/uL (ref 140–400)
RBC: 4.4 MIL/uL (ref 4.20–5.80)
RDW: 14.1 % (ref 11.0–15.0)
WBC: 4.8 10*3/uL (ref 3.8–10.8)

## 2017-02-22 NOTE — Progress Notes (Signed)
Michael RuizJohn is here for his week 19 visit for HPTN 083 Study: A Phase 2b/3 Double Blind Safety and Efficacy Study of Injectable Cabotegravir compared to Daily Oral Tenofovir Disoproxil Fumarate/Emtricitabine (TDF/FTC), For Pre-Exposure Prophylaxis in HIV-Uninfected Cisgender Men and Transgender Women who have sex with Men.  Medication: Investigational Injectable Cabotegravir/placebo compared to Truvada/placebo. Duration: Around 4 years.  He denies any injection site reaction since last visit. No new problems or medications. He continues his doxycycline for his RMSF. He is due to return on 10/11.

## 2017-02-23 LAB — COMPREHENSIVE METABOLIC PANEL
ALK PHOS: 45 U/L (ref 40–115)
ALT: 20 U/L (ref 9–46)
AST: 28 U/L (ref 10–40)
Albumin: 3.8 g/dL (ref 3.6–5.1)
BUN: 22 mg/dL (ref 7–25)
CO2: 22 mmol/L (ref 20–32)
Calcium: 8.9 mg/dL (ref 8.6–10.3)
Chloride: 108 mmol/L (ref 98–110)
Creat: 1 mg/dL (ref 0.60–1.35)
GLUCOSE: 82 mg/dL (ref 65–99)
POTASSIUM: 4.5 mmol/L (ref 3.5–5.3)
Sodium: 139 mmol/L (ref 135–146)
Total Bilirubin: 0.5 mg/dL (ref 0.2–1.2)
Total Protein: 6.2 g/dL (ref 6.1–8.1)

## 2017-02-23 LAB — LIPASE: LIPASE: 18 U/L (ref 7–60)

## 2017-02-23 LAB — AMYLASE: AMYLASE: 49 U/L (ref 21–101)

## 2017-02-23 LAB — PHOSPHORUS: Phosphorus: 3.1 mg/dL (ref 2.5–4.5)

## 2017-02-23 LAB — HIV ANTIBODY (ROUTINE TESTING W REFLEX): HIV 1&2 Ab, 4th Generation: NONREACTIVE

## 2017-02-23 LAB — CK: CK TOTAL: 259 U/L — AB (ref 44–196)

## 2017-04-05 ENCOUNTER — Encounter: Payer: Self-pay | Admitting: *Deleted

## 2017-04-11 ENCOUNTER — Encounter: Payer: Self-pay | Admitting: *Deleted

## 2017-04-11 ENCOUNTER — Encounter (INDEPENDENT_AMBULATORY_CARE_PROVIDER_SITE_OTHER): Payer: Self-pay | Admitting: *Deleted

## 2017-04-11 VITALS — BP 120/74 | HR 69 | Temp 98.8°F | Wt 195.2 lb

## 2017-04-11 DIAGNOSIS — Z006 Encounter for examination for normal comparison and control in clinical research program: Secondary | ICD-10-CM

## 2017-04-11 NOTE — Progress Notes (Signed)
Michael RuizJohn is here for his week 25 visit for HPTN. He denies any current problems or concerns. He did finish up his doxycycline treatment for RMSF. He got an injection of study drug in his Left butt. After his HIV rapid test was confirmed negative. He is to return in 2 weeks for followup.

## 2017-04-12 LAB — CBC WITH DIFFERENTIAL/PLATELET
Basophils Absolute: 59 cells/uL (ref 0–200)
Basophils Relative: 0.8 %
EOS ABS: 207 {cells}/uL (ref 15–500)
Eosinophils Relative: 2.8 %
HCT: 40 % (ref 38.5–50.0)
Hemoglobin: 14 g/dL (ref 13.2–17.1)
Lymphs Abs: 3012 cells/uL (ref 850–3900)
MCH: 30.2 pg (ref 27.0–33.0)
MCHC: 35 g/dL (ref 32.0–36.0)
MCV: 86.2 fL (ref 80.0–100.0)
MPV: 13 fL — ABNORMAL HIGH (ref 7.5–12.5)
Monocytes Relative: 6.6 %
NEUTROS PCT: 49.1 %
Neutro Abs: 3633 cells/uL (ref 1500–7800)
PLATELETS: 182 10*3/uL (ref 140–400)
RBC: 4.64 10*6/uL (ref 4.20–5.80)
RDW: 14.4 % (ref 11.0–15.0)
TOTAL LYMPHOCYTE: 40.7 %
WBC: 7.4 10*3/uL (ref 3.8–10.8)
WBCMIX: 488 {cells}/uL (ref 200–950)

## 2017-04-12 LAB — LIPASE: Lipase: 25 U/L (ref 7–60)

## 2017-04-12 LAB — COMPREHENSIVE METABOLIC PANEL
AG RATIO: 1.5 (calc) (ref 1.0–2.5)
ALKALINE PHOSPHATASE (APISO): 44 U/L (ref 40–115)
ALT: 18 U/L (ref 9–46)
AST: 24 U/L (ref 10–40)
Albumin: 3.8 g/dL (ref 3.6–5.1)
BUN: 25 mg/dL (ref 7–25)
CHLORIDE: 108 mmol/L (ref 98–110)
CO2: 25 mmol/L (ref 20–32)
CREATININE: 1.11 mg/dL (ref 0.60–1.35)
Calcium: 8.7 mg/dL (ref 8.6–10.3)
GLUCOSE: 83 mg/dL (ref 65–99)
Globulin: 2.5 g/dL (calc) (ref 1.9–3.7)
POTASSIUM: 4.4 mmol/L (ref 3.5–5.3)
Sodium: 141 mmol/L (ref 135–146)
TOTAL PROTEIN: 6.3 g/dL (ref 6.1–8.1)
Total Bilirubin: 0.4 mg/dL (ref 0.2–1.2)

## 2017-04-12 LAB — AMYLASE: Amylase: 64 U/L (ref 21–101)

## 2017-04-12 LAB — HIV ANTIBODY (ROUTINE TESTING W REFLEX): HIV 1&2 Ab, 4th Generation: NONREACTIVE

## 2017-04-12 LAB — PHOSPHORUS: Phosphorus: 3.6 mg/dL (ref 2.5–4.5)

## 2017-04-12 LAB — CK: Total CK: 211 U/L — ABNORMAL HIGH (ref 44–196)

## 2017-04-23 ENCOUNTER — Encounter (INDEPENDENT_AMBULATORY_CARE_PROVIDER_SITE_OTHER): Payer: Self-pay | Admitting: *Deleted

## 2017-04-23 VITALS — BP 121/72 | HR 66 | Temp 98.3°F | Wt 192.2 lb

## 2017-04-23 DIAGNOSIS — Z006 Encounter for examination for normal comparison and control in clinical research program: Secondary | ICD-10-CM

## 2017-04-23 NOTE — Progress Notes (Signed)
Study: A Phase 2b/3 Double Blind Safety and Efficacy Study of Injectable Cabotegravir compared to Daily Oral Tenofovir Disoproxil Fumarate/Emtricitabine (TDF/FTC), For Pre-Exposure Prophylaxis in HIV-Uninfected Cisgender Men and Transgender Women who have sex with Men.  Medication: Investigational Injectable Cabotegravir/placebo compared to Truvada/placebo. Duration: Around 4 years.  Michael RuizJohn is here for week 27 visit. Denies any problems with his last injection. No new complaints or medications. Next visit scheduled for 05/31/17 at 8:30am.

## 2017-04-24 LAB — COMPREHENSIVE METABOLIC PANEL
AG Ratio: 1.5 (calc) (ref 1.0–2.5)
ALBUMIN MSPROF: 4 g/dL (ref 3.6–5.1)
ALKALINE PHOSPHATASE (APISO): 47 U/L (ref 40–115)
ALT: 19 U/L (ref 9–46)
AST: 24 U/L (ref 10–40)
BUN: 17 mg/dL (ref 7–25)
CO2: 26 mmol/L (ref 20–32)
CREATININE: 1.19 mg/dL (ref 0.60–1.35)
Calcium: 9.1 mg/dL (ref 8.6–10.3)
Chloride: 109 mmol/L (ref 98–110)
Globulin: 2.6 g/dL (calc) (ref 1.9–3.7)
Glucose, Bld: 87 mg/dL (ref 65–99)
Potassium: 4.4 mmol/L (ref 3.5–5.3)
Sodium: 141 mmol/L (ref 135–146)
Total Bilirubin: 0.7 mg/dL (ref 0.2–1.2)
Total Protein: 6.6 g/dL (ref 6.1–8.1)

## 2017-04-24 LAB — CBC WITH DIFFERENTIAL/PLATELET
Basophils Absolute: 78 cells/uL (ref 0–200)
Basophils Relative: 1.2 %
EOS PCT: 3.4 %
Eosinophils Absolute: 221 cells/uL (ref 15–500)
HCT: 40.2 % (ref 38.5–50.0)
Hemoglobin: 14 g/dL (ref 13.2–17.1)
Lymphs Abs: 3029 cells/uL (ref 850–3900)
MCH: 29.7 pg (ref 27.0–33.0)
MCHC: 34.8 g/dL (ref 32.0–36.0)
MCV: 85.2 fL (ref 80.0–100.0)
MPV: 13.1 fL — ABNORMAL HIGH (ref 7.5–12.5)
Monocytes Relative: 8 %
NEUTROS PCT: 40.8 %
Neutro Abs: 2652 cells/uL (ref 1500–7800)
PLATELETS: 172 10*3/uL (ref 140–400)
RBC: 4.72 10*6/uL (ref 4.20–5.80)
RDW: 14.4 % (ref 11.0–15.0)
TOTAL LYMPHOCYTE: 46.6 %
WBC: 6.5 10*3/uL (ref 3.8–10.8)
WBCMIX: 520 {cells}/uL (ref 200–950)

## 2017-04-24 LAB — AMYLASE: AMYLASE: 56 U/L (ref 21–101)

## 2017-04-24 LAB — CK: CK TOTAL: 205 U/L — AB (ref 44–196)

## 2017-04-24 LAB — PHOSPHORUS: PHOSPHORUS: 3.1 mg/dL (ref 2.5–4.5)

## 2017-04-24 LAB — HIV ANTIBODY (ROUTINE TESTING W REFLEX): HIV: NONREACTIVE

## 2017-04-24 LAB — LIPASE: LIPASE: 22 U/L (ref 7–60)

## 2017-04-25 DIAGNOSIS — Z6825 Body mass index (BMI) 25.0-25.9, adult: Secondary | ICD-10-CM | POA: Diagnosis not present

## 2017-04-25 DIAGNOSIS — M7712 Lateral epicondylitis, left elbow: Secondary | ICD-10-CM | POA: Diagnosis not present

## 2017-04-25 DIAGNOSIS — Z23 Encounter for immunization: Secondary | ICD-10-CM | POA: Diagnosis not present

## 2017-05-31 ENCOUNTER — Encounter (INDEPENDENT_AMBULATORY_CARE_PROVIDER_SITE_OTHER): Payer: Self-pay | Admitting: *Deleted

## 2017-05-31 VITALS — BP 111/74 | HR 64 | Temp 97.3°F | Wt 197.5 lb

## 2017-05-31 DIAGNOSIS — Z006 Encounter for examination for normal comparison and control in clinical research program: Secondary | ICD-10-CM

## 2017-05-31 NOTE — Progress Notes (Signed)
Study: A Phase 2b/3 Double Blind Safety and Efficacy Study of Injectable Cabotegravir compared to Daily Oral Tenofovir Disoproxil Fumarate/Emtricitabine (TDF/FTC), For Pre-Exposure Prophylaxis in HIV-Uninfected Cisgender Men and Transgender Women who have sex with Men.  Medication: Investigational Injectable Cabotegravir/placebo compared to Truvada/placebo. Duration: Around 4 years.  Michael RuizJohn is here for week 33 visit. No new concerns. Rapid HIV non-reactive. Cabotegravir/placebo injection given (L) glute. Site unremarkable. Did not return his oral study medication but states good adherence. Will return next week for safety visit.

## 2017-06-01 LAB — CBC WITH DIFFERENTIAL/PLATELET
BASOS ABS: 58 {cells}/uL (ref 0–200)
Basophils Relative: 0.9 %
EOS PCT: 4.5 %
Eosinophils Absolute: 288 cells/uL (ref 15–500)
HCT: 40.5 % (ref 38.5–50.0)
Hemoglobin: 13.9 g/dL (ref 13.2–17.1)
Lymphs Abs: 2067 cells/uL (ref 850–3900)
MCH: 30.4 pg (ref 27.0–33.0)
MCHC: 34.3 g/dL (ref 32.0–36.0)
MCV: 88.6 fL (ref 80.0–100.0)
MONOS PCT: 5.6 %
MPV: 12.7 fL — ABNORMAL HIGH (ref 7.5–12.5)
NEUTROS PCT: 56.7 %
Neutro Abs: 3629 cells/uL (ref 1500–7800)
Platelets: 184 10*3/uL (ref 140–400)
RBC: 4.57 10*6/uL (ref 4.20–5.80)
RDW: 13.8 % (ref 11.0–15.0)
TOTAL LYMPHOCYTE: 32.3 %
WBC mixed population: 358 cells/uL (ref 200–950)
WBC: 6.4 10*3/uL (ref 3.8–10.8)

## 2017-06-01 LAB — COMPREHENSIVE METABOLIC PANEL
AG Ratio: 1.4 (calc) (ref 1.0–2.5)
ALKALINE PHOSPHATASE (APISO): 44 U/L (ref 40–115)
ALT: 30 U/L (ref 9–46)
AST: 33 U/L (ref 10–40)
Albumin: 3.7 g/dL (ref 3.6–5.1)
BUN: 23 mg/dL (ref 7–25)
CHLORIDE: 107 mmol/L (ref 98–110)
CO2: 27 mmol/L (ref 20–32)
CREATININE: 0.96 mg/dL (ref 0.60–1.35)
Calcium: 9 mg/dL (ref 8.6–10.3)
GLOBULIN: 2.6 g/dL (ref 1.9–3.7)
Glucose, Bld: 79 mg/dL (ref 65–99)
Potassium: 4.4 mmol/L (ref 3.5–5.3)
Sodium: 140 mmol/L (ref 135–146)
Total Bilirubin: 0.6 mg/dL (ref 0.2–1.2)
Total Protein: 6.3 g/dL (ref 6.1–8.1)

## 2017-06-01 LAB — CK: Total CK: 339 U/L — ABNORMAL HIGH (ref 44–196)

## 2017-06-01 LAB — C. TRACHOMATIS/N. GONORRHOEAE RNA
C. TRACHOMATIS RNA, TMA: NOT DETECTED
N. GONORRHOEAE RNA, TMA: NOT DETECTED

## 2017-06-01 LAB — RPR: RPR: NONREACTIVE

## 2017-06-01 LAB — HIV ANTIBODY (ROUTINE TESTING W REFLEX): HIV 1&2 Ab, 4th Generation: NONREACTIVE

## 2017-06-01 LAB — AMYLASE: AMYLASE: 57 U/L (ref 21–101)

## 2017-06-01 LAB — LIPASE: LIPASE: 23 U/L (ref 7–60)

## 2017-06-01 LAB — PHOSPHORUS: PHOSPHORUS: 3.3 mg/dL (ref 2.5–4.5)

## 2017-06-03 LAB — CT/NG RNA, TMA RECTAL
Chlamydia Trachomatis RNA: NOT DETECTED
Neisseria Gonorrhoeae RNA: NOT DETECTED

## 2017-06-07 ENCOUNTER — Encounter: Payer: Self-pay | Admitting: *Deleted

## 2017-06-12 ENCOUNTER — Encounter (INDEPENDENT_AMBULATORY_CARE_PROVIDER_SITE_OTHER): Payer: Self-pay | Admitting: *Deleted

## 2017-06-12 VITALS — BP 121/74 | HR 67 | Temp 98.4°F | Wt 194.5 lb

## 2017-06-12 DIAGNOSIS — Z006 Encounter for examination for normal comparison and control in clinical research program: Secondary | ICD-10-CM

## 2017-06-12 NOTE — Progress Notes (Signed)
Study: A Phase 2b/3 Double Blind Safety and Efficacy Study of Injectable Cabotegravir compared to Daily Oral Tenofovir Disoproxil Fumarate/Emtricitabine (TDF/FTC), For Pre-Exposure Prophylaxis in HIV-Uninfected Cisgender Men and Transgender Women who have sex with Men.  Medication: Investigational Injectable Cabotegravir/placebo compared to Truvada/placebo. Duration: Around 4 years.  Michael RuizJohn is here for week 35 visit. Denies any problems with his last injection. No new concerns or complaints. Next visit scheduled for 07/26/17 at 9:30am.

## 2017-06-13 LAB — CBC WITH DIFFERENTIAL/PLATELET
BASOS ABS: 71 {cells}/uL (ref 0–200)
Basophils Relative: 1.2 %
EOS ABS: 372 {cells}/uL (ref 15–500)
Eosinophils Relative: 6.3 %
HCT: 42.9 % (ref 38.5–50.0)
Hemoglobin: 14.8 g/dL (ref 13.2–17.1)
Lymphs Abs: 2454 cells/uL (ref 850–3900)
MCH: 30.1 pg (ref 27.0–33.0)
MCHC: 34.5 g/dL (ref 32.0–36.0)
MCV: 87.2 fL (ref 80.0–100.0)
MONOS PCT: 7.7 %
MPV: 12.5 fL (ref 7.5–12.5)
NEUTROS PCT: 43.2 %
Neutro Abs: 2549 cells/uL (ref 1500–7800)
PLATELETS: 204 10*3/uL (ref 140–400)
RBC: 4.92 10*6/uL (ref 4.20–5.80)
RDW: 13.4 % (ref 11.0–15.0)
TOTAL LYMPHOCYTE: 41.6 %
WBC: 5.9 10*3/uL (ref 3.8–10.8)
WBCMIX: 454 {cells}/uL (ref 200–950)

## 2017-06-13 LAB — HIV ANTIBODY (ROUTINE TESTING W REFLEX): HIV: NONREACTIVE

## 2017-06-13 LAB — COMPREHENSIVE METABOLIC PANEL
AG Ratio: 1.7 (calc) (ref 1.0–2.5)
ALT: 21 U/L (ref 9–46)
AST: 24 U/L (ref 10–40)
Albumin: 4 g/dL (ref 3.6–5.1)
Alkaline phosphatase (APISO): 47 U/L (ref 40–115)
BUN: 21 mg/dL (ref 7–25)
CO2: 26 mmol/L (ref 20–32)
Calcium: 9.2 mg/dL (ref 8.6–10.3)
Chloride: 107 mmol/L (ref 98–110)
Creat: 1.02 mg/dL (ref 0.60–1.35)
GLUCOSE: 83 mg/dL (ref 65–99)
Globulin: 2.4 g/dL (calc) (ref 1.9–3.7)
Potassium: 4.2 mmol/L (ref 3.5–5.3)
SODIUM: 141 mmol/L (ref 135–146)
TOTAL PROTEIN: 6.4 g/dL (ref 6.1–8.1)
Total Bilirubin: 0.8 mg/dL (ref 0.2–1.2)

## 2017-06-13 LAB — PHOSPHORUS: PHOSPHORUS: 3 mg/dL (ref 2.5–4.5)

## 2017-06-13 LAB — CK: Total CK: 219 U/L — ABNORMAL HIGH (ref 44–196)

## 2017-06-13 LAB — LIPASE: LIPASE: 21 U/L (ref 7–60)

## 2017-06-13 LAB — AMYLASE: Amylase: 60 U/L (ref 21–101)

## 2017-07-26 ENCOUNTER — Encounter (INDEPENDENT_AMBULATORY_CARE_PROVIDER_SITE_OTHER): Payer: Self-pay | Admitting: *Deleted

## 2017-07-26 VITALS — BP 120/70 | HR 68 | Temp 97.3°F | Wt 205.5 lb

## 2017-07-26 DIAGNOSIS — Z006 Encounter for examination for normal comparison and control in clinical research program: Secondary | ICD-10-CM

## 2017-07-26 NOTE — Progress Notes (Signed)
Study: A Phase 2b/3 Double Blind Safety and Efficacy Study of Injectable Cabotegravir compared to Daily Oral Tenofovir Disoproxil Fumarate/Emtricitabine (TDF/FTC), For Pre-Exposure Prophylaxis in HIV-Uninfected Cisgender Men and Transgender Women who have sex with Men.  Medication: Investigational Injectable Cabotegravir/placebo compared to Truvada/placebo. Duration: Around 4 years.  Michael RuizJohn is here for week 41 visit. No new complaints or concerns. Rapid HIV non-reactive. Cabotegravir/placebo injection given (L) buttock. Site unremarkable. He will return in 2 weeks for his next study visit.

## 2017-07-27 LAB — COMPREHENSIVE METABOLIC PANEL
AG Ratio: 1.5 (calc) (ref 1.0–2.5)
ALBUMIN MSPROF: 4 g/dL (ref 3.6–5.1)
ALKALINE PHOSPHATASE (APISO): 51 U/L (ref 40–115)
ALT: 22 U/L (ref 9–46)
AST: 27 U/L (ref 10–40)
BILIRUBIN TOTAL: 0.7 mg/dL (ref 0.2–1.2)
BUN: 22 mg/dL (ref 7–25)
CHLORIDE: 104 mmol/L (ref 98–110)
CO2: 28 mmol/L (ref 20–32)
Calcium: 9.2 mg/dL (ref 8.6–10.3)
Creat: 1.18 mg/dL (ref 0.60–1.35)
GLOBULIN: 2.6 g/dL (ref 1.9–3.7)
Glucose, Bld: 77 mg/dL (ref 65–99)
POTASSIUM: 4.3 mmol/L (ref 3.5–5.3)
Sodium: 139 mmol/L (ref 135–146)
Total Protein: 6.6 g/dL (ref 6.1–8.1)

## 2017-07-27 LAB — CBC WITH DIFFERENTIAL/PLATELET
BASOS PCT: 1 %
Basophils Absolute: 71 cells/uL (ref 0–200)
EOS ABS: 149 {cells}/uL (ref 15–500)
Eosinophils Relative: 2.1 %
HCT: 40.1 % (ref 38.5–50.0)
Hemoglobin: 13.6 g/dL (ref 13.2–17.1)
Lymphs Abs: 2954 cells/uL (ref 850–3900)
MCH: 29.4 pg (ref 27.0–33.0)
MCHC: 33.9 g/dL (ref 32.0–36.0)
MCV: 86.6 fL (ref 80.0–100.0)
MPV: 12.5 fL (ref 7.5–12.5)
Monocytes Relative: 5.4 %
NEUTROS PCT: 49.9 %
Neutro Abs: 3543 cells/uL (ref 1500–7800)
Platelets: 191 10*3/uL (ref 140–400)
RBC: 4.63 10*6/uL (ref 4.20–5.80)
RDW: 13 % (ref 11.0–15.0)
Total Lymphocyte: 41.6 %
WBC: 7.1 10*3/uL (ref 3.8–10.8)
WBCMIX: 383 {cells}/uL (ref 200–950)

## 2017-07-27 LAB — HIV ANTIBODY (ROUTINE TESTING W REFLEX): HIV: NONREACTIVE

## 2017-07-27 LAB — LIPASE: Lipase: 21 U/L (ref 7–60)

## 2017-07-27 LAB — AMYLASE: AMYLASE: 55 U/L (ref 21–101)

## 2017-07-27 LAB — CK: Total CK: 295 U/L — ABNORMAL HIGH (ref 44–196)

## 2017-07-27 LAB — PHOSPHORUS: Phosphorus: 3.4 mg/dL (ref 2.5–4.5)

## 2017-08-09 ENCOUNTER — Encounter (INDEPENDENT_AMBULATORY_CARE_PROVIDER_SITE_OTHER): Payer: Self-pay | Admitting: *Deleted

## 2017-08-09 VITALS — BP 115/69 | HR 72 | Temp 98.1°F | Wt 204.0 lb

## 2017-08-09 DIAGNOSIS — Z006 Encounter for examination for normal comparison and control in clinical research program: Secondary | ICD-10-CM

## 2017-08-09 NOTE — Progress Notes (Signed)
Study: A Phase 2b/3 Double Blind Safety and Efficacy Study of Injectable Cabotegravir compared to Daily Oral Tenofovir Disoproxil Fumarate/Emtricitabine (TDF/FTC), For Pre-Exposure Prophylaxis in HIV-Uninfected Cisgender Men and Transgender Women who have sex with Men.  Medication: Investigational Injectable Cabotegravir/placebo compared to Truvada/placebo. Duration: Around 4 years.  Michael RuizJohn is here for week 43 visit. Denies any site reaction after his last injection. States that he has been very adherent with his oral study medication. No new concerns verbalized. Next injection visit scheduled for 09/18/17 at 11:00am.

## 2017-08-10 LAB — COMPREHENSIVE METABOLIC PANEL
AG Ratio: 1.4 (calc) (ref 1.0–2.5)
ALBUMIN MSPROF: 3.9 g/dL (ref 3.6–5.1)
ALT: 22 U/L (ref 9–46)
AST: 29 U/L (ref 10–40)
Alkaline phosphatase (APISO): 44 U/L (ref 40–115)
BUN: 18 mg/dL (ref 7–25)
CO2: 29 mmol/L (ref 20–32)
CREATININE: 1.06 mg/dL (ref 0.60–1.35)
Calcium: 8.9 mg/dL (ref 8.6–10.3)
Chloride: 105 mmol/L (ref 98–110)
Globulin: 2.7 g/dL (calc) (ref 1.9–3.7)
Glucose, Bld: 123 mg/dL — ABNORMAL HIGH (ref 65–99)
POTASSIUM: 4.4 mmol/L (ref 3.5–5.3)
SODIUM: 140 mmol/L (ref 135–146)
TOTAL PROTEIN: 6.6 g/dL (ref 6.1–8.1)
Total Bilirubin: 0.7 mg/dL (ref 0.2–1.2)

## 2017-08-10 LAB — CBC WITH DIFFERENTIAL/PLATELET
BASOS PCT: 0.8 %
Basophils Absolute: 50 cells/uL (ref 0–200)
EOS PCT: 3.8 %
Eosinophils Absolute: 239 cells/uL (ref 15–500)
HEMATOCRIT: 39.7 % (ref 38.5–50.0)
HEMOGLOBIN: 13.6 g/dL (ref 13.2–17.1)
LYMPHS ABS: 2262 {cells}/uL (ref 850–3900)
MCH: 29.8 pg (ref 27.0–33.0)
MCHC: 34.3 g/dL (ref 32.0–36.0)
MCV: 87.1 fL (ref 80.0–100.0)
MPV: 12.3 fL (ref 7.5–12.5)
Monocytes Relative: 4.7 %
NEUTROS ABS: 3452 {cells}/uL (ref 1500–7800)
Neutrophils Relative %: 54.8 %
Platelets: 192 10*3/uL (ref 140–400)
RBC: 4.56 10*6/uL (ref 4.20–5.80)
RDW: 13.1 % (ref 11.0–15.0)
Total Lymphocyte: 35.9 %
WBC: 6.3 10*3/uL (ref 3.8–10.8)
WBCMIX: 296 {cells}/uL (ref 200–950)

## 2017-08-10 LAB — LIPASE: Lipase: 18 U/L (ref 7–60)

## 2017-08-10 LAB — HIV ANTIBODY (ROUTINE TESTING W REFLEX): HIV: NONREACTIVE

## 2017-08-10 LAB — AMYLASE: AMYLASE: 62 U/L (ref 21–101)

## 2017-08-10 LAB — PHOSPHORUS: PHOSPHORUS: 2.8 mg/dL (ref 2.5–4.5)

## 2017-08-10 LAB — CK: CK TOTAL: 348 U/L — AB (ref 44–196)

## 2017-09-18 ENCOUNTER — Encounter (INDEPENDENT_AMBULATORY_CARE_PROVIDER_SITE_OTHER): Payer: Self-pay | Admitting: *Deleted

## 2017-09-18 VITALS — BP 114/64 | HR 79 | Temp 97.6°F | Wt 203.8 lb

## 2017-09-18 DIAGNOSIS — Z006 Encounter for examination for normal comparison and control in clinical research program: Secondary | ICD-10-CM

## 2017-09-18 NOTE — Progress Notes (Signed)
Study: A Phase 2b/3 Double Blind Safety and Efficacy Study of Injectable Cabotegravir compared to Daily Oral Tenofovir Disoproxil Fumarate/Emtricitabine (TDF/FTC), For Pre-Exposure Prophylaxis in HIV-Uninfected Cisgender Men and Transgender Women who have sex with Men.  Medication: Investigational Injectable Cabotegravir/placebo compared to Truvada/placebo. Duration: Around 4 years.  Michael Duncan is here for week 49 visit. No new complaints or concerns. Compliace 100% with his oral study medication. Rapid HIV non-reactive. Cabotegravir/placebo injection given R buttock. Site unremarkable. He will return in 2 weeks for his next study visit.

## 2017-09-19 LAB — AMYLASE: Amylase: 51 U/L (ref 21–101)

## 2017-09-19 LAB — COMPREHENSIVE METABOLIC PANEL
AG Ratio: 1.8 (calc) (ref 1.0–2.5)
ALT: 28 U/L (ref 9–46)
AST: 36 U/L (ref 10–40)
Albumin: 4 g/dL (ref 3.6–5.1)
Alkaline phosphatase (APISO): 48 U/L (ref 40–115)
BILIRUBIN TOTAL: 0.5 mg/dL (ref 0.2–1.2)
BUN: 16 mg/dL (ref 7–25)
CALCIUM: 8.9 mg/dL (ref 8.6–10.3)
CO2: 28 mmol/L (ref 20–32)
Chloride: 108 mmol/L (ref 98–110)
Creat: 1.17 mg/dL (ref 0.60–1.35)
GLUCOSE: 83 mg/dL (ref 65–99)
Globulin: 2.2 g/dL (calc) (ref 1.9–3.7)
Potassium: 4.6 mmol/L (ref 3.5–5.3)
SODIUM: 141 mmol/L (ref 135–146)
TOTAL PROTEIN: 6.2 g/dL (ref 6.1–8.1)

## 2017-09-19 LAB — CBC WITH DIFFERENTIAL/PLATELET
Basophils Absolute: 58 cells/uL (ref 0–200)
Basophils Relative: 1.1 %
EOS PCT: 3.2 %
Eosinophils Absolute: 170 cells/uL (ref 15–500)
HEMATOCRIT: 40.7 % (ref 38.5–50.0)
HEMOGLOBIN: 14.4 g/dL (ref 13.2–17.1)
LYMPHS ABS: 1935 {cells}/uL (ref 850–3900)
MCH: 30.5 pg (ref 27.0–33.0)
MCHC: 35.4 g/dL (ref 32.0–36.0)
MCV: 86.2 fL (ref 80.0–100.0)
MONOS PCT: 8.6 %
MPV: 12.8 fL — ABNORMAL HIGH (ref 7.5–12.5)
NEUTROS ABS: 2682 {cells}/uL (ref 1500–7800)
Neutrophils Relative %: 50.6 %
Platelets: 147 10*3/uL (ref 140–400)
RBC: 4.72 10*6/uL (ref 4.20–5.80)
RDW: 13.7 % (ref 11.0–15.0)
Total Lymphocyte: 36.5 %
WBC mixed population: 456 cells/uL (ref 200–950)
WBC: 5.3 10*3/uL (ref 3.8–10.8)

## 2017-09-19 LAB — LIPASE: Lipase: 18 U/L (ref 7–60)

## 2017-09-19 LAB — HIV ANTIBODY (ROUTINE TESTING W REFLEX): HIV 1&2 Ab, 4th Generation: NONREACTIVE

## 2017-09-19 LAB — PHOSPHORUS: PHOSPHORUS: 3 mg/dL (ref 2.5–4.5)

## 2017-09-19 LAB — CK: Total CK: 398 U/L — ABNORMAL HIGH (ref 44–196)

## 2017-10-02 ENCOUNTER — Encounter (INDEPENDENT_AMBULATORY_CARE_PROVIDER_SITE_OTHER): Payer: Self-pay | Admitting: *Deleted

## 2017-10-02 VITALS — BP 110/69 | HR 55 | Temp 98.0°F | Wt 204.8 lb

## 2017-10-02 DIAGNOSIS — Z006 Encounter for examination for normal comparison and control in clinical research program: Secondary | ICD-10-CM

## 2017-10-02 NOTE — Progress Notes (Signed)
Study: A Phase 2b/3 Double Blind Safety and Efficacy Study of Injectable Cabotegravir compared to Daily Oral Tenofovir Disoproxil Fumarate/Emtricitabine (TDF/FTC), For Pre-Exposure Prophylaxis in HIV-Uninfected Cisgender Men and Transgender Women who have sex with Men.  Medication: Investigational Injectable Cabotegravir/placebo compared to Truvada/placebo. Duration: Around 4 years.  Michael Duncan is here for week 51 visit. Denied any injection site reaction. No new problems or medications. Verbalized good adherence with his oral study medication. Rapid HIV non-reactive. He will return in May for his next study visit.

## 2017-10-03 LAB — COMPREHENSIVE METABOLIC PANEL
AG RATIO: 1.9 (calc) (ref 1.0–2.5)
ALBUMIN MSPROF: 4.3 g/dL (ref 3.6–5.1)
ALT: 23 U/L (ref 9–46)
AST: 28 U/L (ref 10–40)
Alkaline phosphatase (APISO): 48 U/L (ref 40–115)
BUN: 20 mg/dL (ref 7–25)
CHLORIDE: 106 mmol/L (ref 98–110)
CO2: 27 mmol/L (ref 20–32)
CREATININE: 1.25 mg/dL (ref 0.60–1.35)
Calcium: 9.3 mg/dL (ref 8.6–10.3)
GLOBULIN: 2.3 g/dL (ref 1.9–3.7)
GLUCOSE: 92 mg/dL (ref 65–99)
POTASSIUM: 4.7 mmol/L (ref 3.5–5.3)
SODIUM: 138 mmol/L (ref 135–146)
Total Bilirubin: 0.6 mg/dL (ref 0.2–1.2)
Total Protein: 6.6 g/dL (ref 6.1–8.1)

## 2017-10-03 LAB — CBC WITH DIFFERENTIAL/PLATELET
BASOS PCT: 1.3 %
Basophils Absolute: 69 cells/uL (ref 0–200)
EOS ABS: 101 {cells}/uL (ref 15–500)
Eosinophils Relative: 1.9 %
HCT: 41.3 % (ref 38.5–50.0)
HEMOGLOBIN: 14.5 g/dL (ref 13.2–17.1)
Lymphs Abs: 2178 cells/uL (ref 850–3900)
MCH: 30 pg (ref 27.0–33.0)
MCHC: 35.1 g/dL (ref 32.0–36.0)
MCV: 85.3 fL (ref 80.0–100.0)
MPV: 12.8 fL — AB (ref 7.5–12.5)
Monocytes Relative: 6.8 %
NEUTROS ABS: 2592 {cells}/uL (ref 1500–7800)
Neutrophils Relative %: 48.9 %
PLATELETS: 179 10*3/uL (ref 140–400)
RBC: 4.84 10*6/uL (ref 4.20–5.80)
RDW: 13.6 % (ref 11.0–15.0)
TOTAL LYMPHOCYTE: 41.1 %
WBC: 5.3 10*3/uL (ref 3.8–10.8)
WBCMIX: 360 {cells}/uL (ref 200–950)

## 2017-10-03 LAB — CK: Total CK: 219 U/L — ABNORMAL HIGH (ref 44–196)

## 2017-10-03 LAB — HIV ANTIBODY (ROUTINE TESTING W REFLEX): HIV: NONREACTIVE

## 2017-10-03 LAB — LIPASE: LIPASE: 17 U/L (ref 7–60)

## 2017-10-03 LAB — AMYLASE: Amylase: 51 U/L (ref 21–101)

## 2017-10-03 LAB — PHOSPHORUS: Phosphorus: 2.6 mg/dL (ref 2.5–4.5)

## 2017-11-07 DIAGNOSIS — B070 Plantar wart: Secondary | ICD-10-CM | POA: Diagnosis not present

## 2017-11-07 DIAGNOSIS — D179 Benign lipomatous neoplasm, unspecified: Secondary | ICD-10-CM | POA: Diagnosis not present

## 2017-11-07 DIAGNOSIS — J342 Deviated nasal septum: Secondary | ICD-10-CM | POA: Diagnosis not present

## 2017-11-07 DIAGNOSIS — L819 Disorder of pigmentation, unspecified: Secondary | ICD-10-CM | POA: Diagnosis not present

## 2017-11-15 ENCOUNTER — Encounter (INDEPENDENT_AMBULATORY_CARE_PROVIDER_SITE_OTHER): Payer: Self-pay

## 2017-11-15 VITALS — BP 104/65 | HR 65 | Temp 98.2°F | Wt 200.0 lb

## 2017-11-15 DIAGNOSIS — Z006 Encounter for examination for normal comparison and control in clinical research program: Secondary | ICD-10-CM

## 2017-11-15 NOTE — Progress Notes (Signed)
Patient attended week 57 study visit for injection on [id number]. No complaints were voiced. Patient has had no changes in medication or health status. Fasting labs were performed today as a part of the protocol. Injection administered in R glute at 1014 with no issue. Will return in two weeks on 6/6 for follow up safety visit.

## 2017-11-16 ENCOUNTER — Telehealth: Payer: Self-pay | Admitting: Infectious Disease

## 2017-11-16 ENCOUNTER — Other Ambulatory Visit: Payer: Self-pay

## 2017-11-16 MED ORDER — DOXYCYCLINE HYCLATE 100 MG PO TABS: 100.0000 mg | ORAL_TABLET | Freq: Two times a day (BID) | ORAL | 0 refills | Status: DC

## 2017-11-16 NOTE — Telephone Encounter (Signed)
Have edited and sent to requested pharmacy.

## 2017-11-16 NOTE — Telephone Encounter (Signed)
There is NO pharmacy set up in system. I have put in order but I neeed to know where to send it

## 2017-11-16 NOTE — Progress Notes (Signed)
This was urine, the rectal swab is still pending. He prefers you send a script.

## 2017-11-17 LAB — COMPREHENSIVE METABOLIC PANEL
AG Ratio: 1.7 (calc) (ref 1.0–2.5)
ALBUMIN MSPROF: 4 g/dL (ref 3.6–5.1)
ALT: 26 U/L (ref 9–46)
AST: 44 U/L — ABNORMAL HIGH (ref 10–40)
Alkaline phosphatase (APISO): 48 U/L (ref 40–115)
BUN: 23 mg/dL (ref 7–25)
CHLORIDE: 107 mmol/L (ref 98–110)
CO2: 25 mmol/L (ref 20–32)
CREATININE: 1.12 mg/dL (ref 0.60–1.35)
Calcium: 8.8 mg/dL (ref 8.6–10.3)
GLOBULIN: 2.3 g/dL (ref 1.9–3.7)
Glucose, Bld: 92 mg/dL (ref 65–99)
POTASSIUM: 4.2 mmol/L (ref 3.5–5.3)
SODIUM: 140 mmol/L (ref 135–146)
TOTAL PROTEIN: 6.3 g/dL (ref 6.1–8.1)
Total Bilirubin: 0.9 mg/dL (ref 0.2–1.2)

## 2017-11-17 LAB — CBC WITH DIFFERENTIAL/PLATELET
BASOS PCT: 1.1 %
Basophils Absolute: 62 cells/uL (ref 0–200)
EOS PCT: 2.5 %
Eosinophils Absolute: 140 cells/uL (ref 15–500)
HCT: 38.3 % — ABNORMAL LOW (ref 38.5–50.0)
Hemoglobin: 13.4 g/dL (ref 13.2–17.1)
Lymphs Abs: 2027 cells/uL (ref 850–3900)
MCH: 30.3 pg (ref 27.0–33.0)
MCHC: 35 g/dL (ref 32.0–36.0)
MCV: 86.7 fL (ref 80.0–100.0)
MPV: 13.1 fL — AB (ref 7.5–12.5)
Monocytes Relative: 8.8 %
NEUTROS ABS: 2878 {cells}/uL (ref 1500–7800)
NEUTROS PCT: 51.4 %
PLATELETS: 154 10*3/uL (ref 140–400)
RBC: 4.42 10*6/uL (ref 4.20–5.80)
RDW: 13.5 % (ref 11.0–15.0)
Total Lymphocyte: 36.2 %
WBC mixed population: 493 cells/uL (ref 200–950)
WBC: 5.6 10*3/uL (ref 3.8–10.8)

## 2017-11-17 LAB — HEPATITIS C ANTIBODY
HEP C AB: NONREACTIVE
SIGNAL TO CUT-OFF: 0.03 (ref <1.00)

## 2017-11-17 LAB — URINALYSIS
BILIRUBIN URINE: NEGATIVE
Glucose, UA: NEGATIVE
Hgb urine dipstick: NEGATIVE
KETONES UR: NEGATIVE
NITRITE: NEGATIVE
Protein, ur: NEGATIVE
SPECIFIC GRAVITY, URINE: 1.008 (ref 1.001–1.03)
pH: 8 (ref 5.0–8.0)

## 2017-11-17 LAB — CK: Total CK: 921 U/L — ABNORMAL HIGH (ref 44–196)

## 2017-11-17 LAB — RPR: RPR Ser Ql: NONREACTIVE

## 2017-11-17 LAB — LIPID PANEL
Cholesterol: 130 mg/dL (ref <200)
HDL: 41 mg/dL (ref >40)
LDL Cholesterol (Calc): 75 mg/dL (calc)
NON-HDL CHOLESTEROL (CALC): 89 mg/dL (ref <130)
TRIGLYCERIDES: 49 mg/dL (ref <150)
Total CHOL/HDL Ratio: 3.2 (calc) (ref <5.0)

## 2017-11-17 LAB — AMYLASE: AMYLASE: 51 U/L (ref 21–101)

## 2017-11-17 LAB — C. TRACHOMATIS/N. GONORRHOEAE RNA
C. trachomatis RNA, TMA: DETECTED — AB
N. GONORRHOEAE RNA, TMA: NOT DETECTED

## 2017-11-17 LAB — CT/NG RNA, TMA RECTAL
Chlamydia Trachomatis RNA: DETECTED — AB
NEISSERIA GONORRHOEAE RNA: NOT DETECTED

## 2017-11-17 LAB — HIV ANTIBODY (ROUTINE TESTING W REFLEX): HIV: NONREACTIVE

## 2017-11-17 LAB — LIPASE: Lipase: 22 U/L (ref 7–60)

## 2017-11-17 LAB — PHOSPHORUS: Phosphorus: 2.7 mg/dL (ref 2.5–4.5)

## 2017-11-19 ENCOUNTER — Other Ambulatory Visit: Payer: Self-pay | Admitting: Infectious Disease

## 2017-11-19 MED ORDER — DOXYCYCLINE HYCLATE 100 MG PO TABS: 100.0000 mg | ORAL_TABLET | Freq: Two times a day (BID) | ORAL | 0 refills | Status: DC

## 2017-11-19 NOTE — Telephone Encounter (Signed)
Perfect script sent

## 2017-11-19 NOTE — Progress Notes (Signed)
Rx sent thanks Vernona Rieger!

## 2017-11-29 ENCOUNTER — Encounter (INDEPENDENT_AMBULATORY_CARE_PROVIDER_SITE_OTHER): Payer: Self-pay | Admitting: *Deleted

## 2017-11-29 VITALS — BP 120/73 | HR 56 | Temp 98.3°F | Wt 194.8 lb

## 2017-11-29 DIAGNOSIS — Z006 Encounter for examination for normal comparison and control in clinical research program: Secondary | ICD-10-CM

## 2017-11-29 NOTE — Progress Notes (Signed)
Michael RuizJohn is here for his week 59 safety visit [facility]. He denies any problems with the last injection. And he denies any other issues or medications. He said he may have missed 1 pill over past 2 weeks. He will be returning 7/18.

## 2017-11-30 LAB — COMPREHENSIVE METABOLIC PANEL
AG RATIO: 2 (calc) (ref 1.0–2.5)
ALT: 19 U/L (ref 9–46)
AST: 30 U/L (ref 10–40)
Albumin: 4.2 g/dL (ref 3.6–5.1)
Alkaline phosphatase (APISO): 45 U/L (ref 40–115)
BILIRUBIN TOTAL: 1.2 mg/dL (ref 0.2–1.2)
BUN: 17 mg/dL (ref 7–25)
CALCIUM: 8.9 mg/dL (ref 8.6–10.3)
CHLORIDE: 108 mmol/L (ref 98–110)
CO2: 27 mmol/L (ref 20–32)
Creat: 1.09 mg/dL (ref 0.60–1.35)
GLOBULIN: 2.1 g/dL (ref 1.9–3.7)
GLUCOSE: 95 mg/dL (ref 65–99)
Potassium: 4.5 mmol/L (ref 3.5–5.3)
Sodium: 139 mmol/L (ref 135–146)
Total Protein: 6.3 g/dL (ref 6.1–8.1)

## 2017-11-30 LAB — CBC WITH DIFFERENTIAL/PLATELET
BASOS ABS: 39 {cells}/uL (ref 0–200)
BASOS PCT: 0.8 %
EOS ABS: 108 {cells}/uL (ref 15–500)
Eosinophils Relative: 2.2 %
HEMATOCRIT: 38.3 % — AB (ref 38.5–50.0)
Hemoglobin: 13 g/dL — ABNORMAL LOW (ref 13.2–17.1)
LYMPHS ABS: 1632 {cells}/uL (ref 850–3900)
MCH: 30.4 pg (ref 27.0–33.0)
MCHC: 33.9 g/dL (ref 32.0–36.0)
MCV: 89.5 fL (ref 80.0–100.0)
MPV: 12.7 fL — AB (ref 7.5–12.5)
Monocytes Relative: 7.4 %
NEUTROS ABS: 2759 {cells}/uL (ref 1500–7800)
Neutrophils Relative %: 56.3 %
Platelets: 165 10*3/uL (ref 140–400)
RBC: 4.28 10*6/uL (ref 4.20–5.80)
RDW: 13.8 % (ref 11.0–15.0)
Total Lymphocyte: 33.3 %
WBC: 4.9 10*3/uL (ref 3.8–10.8)
WBCMIX: 363 {cells}/uL (ref 200–950)

## 2017-11-30 LAB — LIPASE: LIPASE: 15 U/L (ref 7–60)

## 2017-11-30 LAB — HIV ANTIBODY (ROUTINE TESTING W REFLEX): HIV 1&2 Ab, 4th Generation: NONREACTIVE

## 2017-11-30 LAB — CK: CK TOTAL: 405 U/L — AB (ref 44–196)

## 2017-11-30 LAB — PHOSPHORUS: Phosphorus: 2.6 mg/dL (ref 2.5–4.5)

## 2017-11-30 LAB — AMYLASE: AMYLASE: 44 U/L (ref 21–101)

## 2017-12-03 DIAGNOSIS — J343 Hypertrophy of nasal turbinates: Secondary | ICD-10-CM | POA: Insufficient documentation

## 2017-12-03 DIAGNOSIS — J342 Deviated nasal septum: Secondary | ICD-10-CM | POA: Diagnosis not present

## 2017-12-03 DIAGNOSIS — J324 Chronic pansinusitis: Secondary | ICD-10-CM | POA: Insufficient documentation

## 2017-12-31 DIAGNOSIS — J342 Deviated nasal septum: Secondary | ICD-10-CM | POA: Diagnosis not present

## 2018-01-02 ENCOUNTER — Ambulatory Visit (INDEPENDENT_AMBULATORY_CARE_PROVIDER_SITE_OTHER): Payer: BLUE CROSS/BLUE SHIELD | Admitting: Podiatry

## 2018-01-02 ENCOUNTER — Encounter: Payer: Self-pay | Admitting: Podiatry

## 2018-01-02 ENCOUNTER — Other Ambulatory Visit: Payer: Self-pay

## 2018-01-02 VITALS — BP 102/60 | HR 61 | Resp 16

## 2018-01-02 DIAGNOSIS — B079 Viral wart, unspecified: Secondary | ICD-10-CM

## 2018-01-02 NOTE — Progress Notes (Signed)
Subjective:   Patient ID: Michael Duncan, male   DOB: 37 y.o.   MRN: [id number]   HPI Patient states that had problems underneath his right foot for around a year and then had a new growth of lesions that are bothersome and that has been over the last few months.  Patient does not remember any kind of initial injury or other issue associated with this   Review of Systems  All other systems reviewed and are negative.       Objective:  Physical Exam  Constitutional: He appears well-developed and well-nourished.  Cardiovascular: Intact distal pulses.  Pulmonary/Chest: Effort normal.  Musculoskeletal: Normal range of motion.  Neurological: He is alert.  Skin: Skin is warm.  Nursing note and vitals reviewed.   Neurovascular status found to be intact muscle strength is adequate range of motion within normal limits with patient found to have lesion underneath the first metatarsal head right and then a large group of lesions underneath the right big toe and the proximal crease.  Upon debridement pinpoint bleeding is noted they are painful to lateral pressure     Assessment:  Multiple patches of verruca plantaris plantar aspect right     Plan:  H&P condition reviewed and were going to try utilizing immune agent with sterile dressings which was applied today and explained what to do if it should blister and then I went ahead and I started him on topical compounding cream with 5-fluorouracil.  Reappoint in 5 weeks to recheck or earlier if needed

## 2018-01-10 ENCOUNTER — Encounter (INDEPENDENT_AMBULATORY_CARE_PROVIDER_SITE_OTHER): Payer: Self-pay

## 2018-01-10 VITALS — BP 111/72 | HR 51 | Temp 98.1°F | Wt 192.0 lb

## 2018-01-10 DIAGNOSIS — Z006 Encounter for examination for normal comparison and control in clinical research program: Secondary | ICD-10-CM

## 2018-01-10 NOTE — Progress Notes (Signed)
Participant here for [id number] study for injection visit. Vital signs stable. Rapid HIV negative. Participant voices no complaints. IM injection of cabotegravir/placebo given in left glute. New dispense of oral IP provided. Participant is scheduled for follow up visit on 01/18/18.

## 2018-01-11 LAB — CBC WITH DIFFERENTIAL/PLATELET
Basophils Absolute: 78 cells/uL (ref 0–200)
Basophils Relative: 1.3 %
EOS ABS: 120 {cells}/uL (ref 15–500)
Eosinophils Relative: 2 %
HCT: 38.5 % (ref 38.5–50.0)
Hemoglobin: 13.7 g/dL (ref 13.2–17.1)
Lymphs Abs: 2478 cells/uL (ref 850–3900)
MCH: 30.4 pg (ref 27.0–33.0)
MCHC: 35.6 g/dL (ref 32.0–36.0)
MCV: 85.6 fL (ref 80.0–100.0)
MONOS PCT: 7.5 %
MPV: 13.2 fL — ABNORMAL HIGH (ref 7.5–12.5)
NEUTROS PCT: 47.9 %
Neutro Abs: 2874 cells/uL (ref 1500–7800)
Platelets: 166 10*3/uL (ref 140–400)
RBC: 4.5 10*6/uL (ref 4.20–5.80)
RDW: 13.4 % (ref 11.0–15.0)
TOTAL LYMPHOCYTE: 41.3 %
WBC mixed population: 450 cells/uL (ref 200–950)
WBC: 6 10*3/uL (ref 3.8–10.8)

## 2018-01-11 LAB — COMPREHENSIVE METABOLIC PANEL
AG RATIO: 1.6 (calc) (ref 1.0–2.5)
ALBUMIN MSPROF: 3.9 g/dL (ref 3.6–5.1)
ALT: 16 U/L (ref 9–46)
AST: 24 U/L (ref 10–40)
Alkaline phosphatase (APISO): 45 U/L (ref 40–115)
BUN: 14 mg/dL (ref 7–25)
CHLORIDE: 107 mmol/L (ref 98–110)
CO2: 26 mmol/L (ref 20–32)
CREATININE: 1.12 mg/dL (ref 0.60–1.35)
Calcium: 9.1 mg/dL (ref 8.6–10.3)
GLOBULIN: 2.5 g/dL (ref 1.9–3.7)
GLUCOSE: 108 mg/dL — AB (ref 65–99)
Potassium: 4.2 mmol/L (ref 3.5–5.3)
SODIUM: 139 mmol/L (ref 135–146)
TOTAL PROTEIN: 6.4 g/dL (ref 6.1–8.1)
Total Bilirubin: 0.8 mg/dL (ref 0.2–1.2)

## 2018-01-11 LAB — AMYLASE: AMYLASE: 49 U/L (ref 21–101)

## 2018-01-11 LAB — CK: Total CK: 180 U/L (ref 44–196)

## 2018-01-11 LAB — PHOSPHORUS: Phosphorus: 3.1 mg/dL (ref 2.5–4.5)

## 2018-01-11 LAB — LIPASE: Lipase: 18 U/L (ref 7–60)

## 2018-01-11 LAB — HIV ANTIBODY (ROUTINE TESTING W REFLEX): HIV: NONREACTIVE

## 2018-01-18 ENCOUNTER — Encounter (INDEPENDENT_AMBULATORY_CARE_PROVIDER_SITE_OTHER): Payer: Self-pay | Admitting: *Deleted

## 2018-01-18 VITALS — BP 115/76 | HR 59 | Temp 98.1°F | Wt 198.0 lb

## 2018-01-18 DIAGNOSIS — Z006 Encounter for examination for normal comparison and control in clinical research program: Secondary | ICD-10-CM

## 2018-01-18 NOTE — Progress Notes (Signed)
Michael RuizJohn is here for his week 3967 HPTN 083 visit. Denied any injection site reaction. No new complaint or concern. Verbalized excellent adherence with his oral study medication. He will return in September for his next injection visit.

## 2018-01-19 LAB — LIPASE: Lipase: 18 U/L (ref 7–60)

## 2018-01-19 LAB — CBC WITH DIFFERENTIAL/PLATELET
BASOS ABS: 50 {cells}/uL (ref 0–200)
Basophils Relative: 0.9 %
EOS ABS: 123 {cells}/uL (ref 15–500)
EOS PCT: 2.2 %
HEMATOCRIT: 41.4 % (ref 38.5–50.0)
Hemoglobin: 14.1 g/dL (ref 13.2–17.1)
LYMPHS ABS: 1870 {cells}/uL (ref 850–3900)
MCH: 29.6 pg (ref 27.0–33.0)
MCHC: 34.1 g/dL (ref 32.0–36.0)
MCV: 86.8 fL (ref 80.0–100.0)
MPV: 12.8 fL — ABNORMAL HIGH (ref 7.5–12.5)
Monocytes Relative: 6.6 %
NEUTROS PCT: 56.9 %
Neutro Abs: 3186 cells/uL (ref 1500–7800)
Platelets: 188 10*3/uL (ref 140–400)
RBC: 4.77 10*6/uL (ref 4.20–5.80)
RDW: 13.1 % (ref 11.0–15.0)
Total Lymphocyte: 33.4 %
WBC: 5.6 10*3/uL (ref 3.8–10.8)
WBCMIX: 370 {cells}/uL (ref 200–950)

## 2018-01-19 LAB — HIV ANTIBODY (ROUTINE TESTING W REFLEX): HIV 1&2 Ab, 4th Generation: NONREACTIVE

## 2018-01-19 LAB — COMPREHENSIVE METABOLIC PANEL
AG Ratio: 1.6 (calc) (ref 1.0–2.5)
ALKALINE PHOSPHATASE (APISO): 45 U/L (ref 40–115)
ALT: 20 U/L (ref 9–46)
AST: 35 U/L (ref 10–40)
Albumin: 4.1 g/dL (ref 3.6–5.1)
BILIRUBIN TOTAL: 0.7 mg/dL (ref 0.2–1.2)
BUN: 15 mg/dL (ref 7–25)
CALCIUM: 9.2 mg/dL (ref 8.6–10.3)
CHLORIDE: 105 mmol/L (ref 98–110)
CO2: 28 mmol/L (ref 20–32)
Creat: 1.18 mg/dL (ref 0.60–1.35)
GLOBULIN: 2.5 g/dL (ref 1.9–3.7)
Glucose, Bld: 103 mg/dL — ABNORMAL HIGH (ref 65–99)
Potassium: 4.8 mmol/L (ref 3.5–5.3)
SODIUM: 138 mmol/L (ref 135–146)
TOTAL PROTEIN: 6.6 g/dL (ref 6.1–8.1)

## 2018-01-19 LAB — AMYLASE: Amylase: 54 U/L (ref 21–101)

## 2018-01-19 LAB — PHOSPHORUS: Phosphorus: 3.4 mg/dL (ref 2.5–4.5)

## 2018-01-19 LAB — CK: Total CK: 496 U/L — ABNORMAL HIGH (ref 44–196)

## 2018-01-24 ENCOUNTER — Encounter (HOSPITAL_COMMUNITY): Payer: Self-pay

## 2018-01-24 ENCOUNTER — Emergency Department (HOSPITAL_COMMUNITY)
Admission: EM | Admit: 2018-01-24 | Discharge: 2018-01-24 | Disposition: A | Discharge status: Home / Self Care | Payer: BLUE CROSS/BLUE SHIELD | Attending: Emergency Medicine | Admitting: Emergency Medicine

## 2018-01-24 ENCOUNTER — Emergency Department (HOSPITAL_COMMUNITY): Payer: BLUE CROSS/BLUE SHIELD

## 2018-01-24 DIAGNOSIS — Z79899 Other long term (current) drug therapy: Secondary | ICD-10-CM | POA: Insufficient documentation

## 2018-01-24 DIAGNOSIS — R079 Chest pain, unspecified: Secondary | ICD-10-CM | POA: Diagnosis not present

## 2018-01-24 DIAGNOSIS — R0789 Other chest pain: Secondary | ICD-10-CM | POA: Diagnosis not present

## 2018-01-24 DIAGNOSIS — S299XXA Unspecified injury of thorax, initial encounter: Secondary | ICD-10-CM | POA: Diagnosis not present

## 2018-01-24 DIAGNOSIS — R1010 Upper abdominal pain, unspecified: Secondary | ICD-10-CM | POA: Diagnosis not present

## 2018-01-24 MED ORDER — NAPROXEN 250 MG PO TABS
500.0000 mg | ORAL_TABLET | Freq: Once | ORAL | Status: DC
  Filled 2018-01-24: qty 2

## 2018-01-24 MED ORDER — IBUPROFEN 600 MG PO TABS: 600.0000 mg | ORAL_TABLET | Freq: Four times a day (QID) | ORAL | 0 refills | Status: DC | PRN
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
## 2018-02-06 ENCOUNTER — Ambulatory Visit: Payer: BLUE CROSS/BLUE SHIELD | Admitting: Podiatry

## 2018-02-11 ENCOUNTER — Ambulatory Visit: Payer: BLUE CROSS/BLUE SHIELD | Admitting: Podiatry

## 2018-03-07 ENCOUNTER — Encounter (INDEPENDENT_AMBULATORY_CARE_PROVIDER_SITE_OTHER): Payer: Self-pay

## 2018-03-07 VITALS — BP 114/68 | HR 59 | Temp 97.7°F | Wt 202.5 lb

## 2018-03-07 DIAGNOSIS — Z006 Encounter for examination for normal comparison and control in clinical research program: Secondary | ICD-10-CM

## 2018-03-07 NOTE — Progress Notes (Signed)
Participant here for [id number] study visit. No complaints voiced. Rapid HIV non-reactive. Participant states he has had no issues with recalling to take IP daily. He was provided with new dispense of oral IP and IM injection of cabotegravir/placebo in right glute. Scheduled for follow up study visit on 9/24.

## 2018-03-08 LAB — CBC WITH DIFFERENTIAL/PLATELET
Basophils Absolute: 51 cells/uL (ref 0–200)
Basophils Relative: 1.1 %
Eosinophils Absolute: 129 cells/uL (ref 15–500)
Eosinophils Relative: 2.8 %
HCT: 38.3 % — ABNORMAL LOW (ref 38.5–50.0)
Hemoglobin: 13.2 g/dL (ref 13.2–17.1)
Lymphs Abs: 1840 cells/uL (ref 850–3900)
MCH: 29.5 pg (ref 27.0–33.0)
MCHC: 34.5 g/dL (ref 32.0–36.0)
MCV: 85.7 fL (ref 80.0–100.0)
MPV: 12.8 fL — ABNORMAL HIGH (ref 7.5–12.5)
Monocytes Relative: 8 %
Neutro Abs: 2213 cells/uL (ref 1500–7800)
Neutrophils Relative %: 48.1 %
Platelets: 168 10*3/uL (ref 140–400)
RBC: 4.47 10*6/uL (ref 4.20–5.80)
RDW: 13.8 % (ref 11.0–15.0)
Total Lymphocyte: 40 %
WBC mixed population: 368 cells/uL (ref 200–950)
WBC: 4.6 10*3/uL (ref 3.8–10.8)

## 2018-03-08 LAB — HIV ANTIBODY (ROUTINE TESTING W REFLEX): HIV 1&2 Ab, 4th Generation: NONREACTIVE

## 2018-03-08 LAB — COMPREHENSIVE METABOLIC PANEL
AG Ratio: 1.6 (calc) (ref 1.0–2.5)
ALBUMIN MSPROF: 3.8 g/dL (ref 3.6–5.1)
ALT: 20 U/L (ref 9–46)
AST: 32 U/L (ref 10–40)
Alkaline phosphatase (APISO): 43 U/L (ref 40–115)
BUN: 18 mg/dL (ref 7–25)
CHLORIDE: 108 mmol/L (ref 98–110)
CO2: 29 mmol/L (ref 20–32)
CREATININE: 1.35 mg/dL (ref 0.60–1.35)
Calcium: 9 mg/dL (ref 8.6–10.3)
GLOBULIN: 2.4 g/dL (ref 1.9–3.7)
GLUCOSE: 101 mg/dL — AB (ref 65–99)
POTASSIUM: 4.1 mmol/L (ref 3.5–5.3)
SODIUM: 143 mmol/L (ref 135–146)
Total Bilirubin: 0.5 mg/dL (ref 0.2–1.2)
Total Protein: 6.2 g/dL (ref 6.1–8.1)

## 2018-03-08 LAB — AMYLASE: Amylase: 49 U/L (ref 21–101)

## 2018-03-08 LAB — CK: Total CK: 619 U/L — ABNORMAL HIGH (ref 44–196)

## 2018-03-08 LAB — PHOSPHORUS: Phosphorus: 4.1 mg/dL (ref 2.5–4.5)

## 2018-03-08 LAB — LIPASE: Lipase: 18 U/L (ref 7–60)

## 2018-03-19 ENCOUNTER — Encounter (INDEPENDENT_AMBULATORY_CARE_PROVIDER_SITE_OTHER): Payer: Self-pay

## 2018-03-19 VITALS — BP 116/63 | HR 75 | Temp 98.1°F

## 2018-03-19 DIAGNOSIS — Z006 Encounter for examination for normal comparison and control in clinical research program: Secondary | ICD-10-CM

## 2018-03-19 NOTE — Research (Signed)
Participant here for [id number] visit for follow up. Rapid HIV non-reactive. Participant voiced no complaints with last injection. Scheduled for next visit on 11/7.

## 2018-03-20 ENCOUNTER — Ambulatory Visit (INDEPENDENT_AMBULATORY_CARE_PROVIDER_SITE_OTHER): Payer: BLUE CROSS/BLUE SHIELD | Admitting: Podiatry

## 2018-03-20 ENCOUNTER — Encounter: Payer: Self-pay | Admitting: Podiatry

## 2018-03-20 DIAGNOSIS — B079 Viral wart, unspecified: Secondary | ICD-10-CM | POA: Diagnosis not present

## 2018-03-20 LAB — LIPASE: LIPASE: 20 U/L (ref 7–60)

## 2018-03-20 LAB — CBC WITH DIFFERENTIAL/PLATELET
Basophils Absolute: 59 cells/uL (ref 0–200)
Basophils Relative: 1.2 %
Eosinophils Absolute: 98 cells/uL (ref 15–500)
Eosinophils Relative: 2 %
HCT: 40.5 % (ref 38.5–50.0)
Hemoglobin: 13.8 g/dL (ref 13.2–17.1)
Lymphs Abs: 2171 cells/uL (ref 850–3900)
MCH: 29.1 pg (ref 27.0–33.0)
MCHC: 34.1 g/dL (ref 32.0–36.0)
MCV: 85.3 fL (ref 80.0–100.0)
MONOS PCT: 10 %
MPV: 13.2 fL — ABNORMAL HIGH (ref 7.5–12.5)
NEUTROS PCT: 42.5 %
Neutro Abs: 2083 cells/uL (ref 1500–7800)
PLATELETS: 157 10*3/uL (ref 140–400)
RBC: 4.75 10*6/uL (ref 4.20–5.80)
RDW: 13.4 % (ref 11.0–15.0)
TOTAL LYMPHOCYTE: 44.3 %
WBC: 4.9 10*3/uL (ref 3.8–10.8)
WBCMIX: 490 {cells}/uL (ref 200–950)

## 2018-03-20 LAB — COMPREHENSIVE METABOLIC PANEL
AG Ratio: 1.8 (calc) (ref 1.0–2.5)
ALKALINE PHOSPHATASE (APISO): 46 U/L (ref 40–115)
ALT: 27 U/L (ref 9–46)
AST: 35 U/L (ref 10–40)
Albumin: 3.9 g/dL (ref 3.6–5.1)
BILIRUBIN TOTAL: 0.6 mg/dL (ref 0.2–1.2)
BUN: 16 mg/dL (ref 7–25)
CALCIUM: 9.1 mg/dL (ref 8.6–10.3)
CO2: 26 mmol/L (ref 20–32)
Chloride: 108 mmol/L (ref 98–110)
Creat: 1.14 mg/dL (ref 0.60–1.35)
Globulin: 2.2 g/dL (calc) (ref 1.9–3.7)
Glucose, Bld: 112 mg/dL — ABNORMAL HIGH (ref 65–99)
Potassium: 4.1 mmol/L (ref 3.5–5.3)
Sodium: 141 mmol/L (ref 135–146)
Total Protein: 6.1 g/dL (ref 6.1–8.1)

## 2018-03-20 LAB — PHOSPHORUS: PHOSPHORUS: 3.5 mg/dL (ref 2.5–4.5)

## 2018-03-20 LAB — CK: CK TOTAL: 335 U/L — AB (ref 44–196)

## 2018-03-20 LAB — AMYLASE: AMYLASE: 49 U/L (ref 21–101)

## 2018-03-20 LAB — HIV ANTIBODY (ROUTINE TESTING W REFLEX): HIV 1&2 Ab, 4th Generation: NONREACTIVE

## 2018-03-22 DIAGNOSIS — Z23 Encounter for immunization: Secondary | ICD-10-CM | POA: Diagnosis not present

## 2018-03-22 DIAGNOSIS — Z0000 Encounter for general adult medical examination without abnormal findings: Secondary | ICD-10-CM | POA: Diagnosis not present

## 2018-03-23 NOTE — Progress Notes (Signed)
Subjective:   Patient ID: Michael Duncan, male   DOB: 37 y.o.   MRN: [id number]   HPI Patient states doing well with the lesions on the right foot and feels like they have resolved   ROS      Objective:  Physical Exam  Neurovascular status intact with patient's right plantar foot showing reduced lesion formation     Assessment:  Doing well post verruca treatment right     Plan:  Educated him that these are viral and they still can recur and at this point were just can put him on a watch and see back as needed

## 2018-03-28 DIAGNOSIS — Z131 Encounter for screening for diabetes mellitus: Secondary | ICD-10-CM | POA: Diagnosis not present

## 2018-03-28 DIAGNOSIS — Z136 Encounter for screening for cardiovascular disorders: Secondary | ICD-10-CM | POA: Diagnosis not present

## 2018-04-24 DIAGNOSIS — L814 Other melanin hyperpigmentation: Secondary | ICD-10-CM | POA: Diagnosis not present

## 2018-04-24 DIAGNOSIS — D225 Melanocytic nevi of trunk: Secondary | ICD-10-CM | POA: Diagnosis not present

## 2018-05-03 ENCOUNTER — Other Ambulatory Visit: Payer: Self-pay | Admitting: *Deleted

## 2018-05-03 DIAGNOSIS — Z006 Encounter for examination for normal comparison and control in clinical research program: Secondary | ICD-10-CM

## 2018-05-03 MED ORDER — STUDY - HPTN 083 - CABOTEGRAVIR 600MG/3ML INJECTION OR PLACEBO (PI-VAN DAM): 600.0000 mg | INJECTION | INTRAMUSCULAR | 0 refills | Status: DC

## 2018-05-03 MED ORDER — STUDY - HPTN 083 - EMTRICITABINE/TENOFOVIR DISOPROXIL FUMARATE 200-300MG (TRUVADA) OR PLACEBO TABLET (PI-VAN DAM): 1.0000 | ORAL_TABLET | Freq: Every day | ORAL | 0 refills | Status: AC

## 2018-05-03 MED ORDER — STUDY - HPTN 083 - EMTRICITABINE/TENOFOVIR DISOPROXIL FUMARATE 200-300MG (TRUVADA) OR PLACEBO TABLET (PI-VAN DAM): 1.0000 | ORAL_TABLET | Freq: Every day | ORAL | 3 refills | Status: DC

## 2018-05-09 ENCOUNTER — Encounter (INDEPENDENT_AMBULATORY_CARE_PROVIDER_SITE_OTHER): Payer: Self-pay | Admitting: *Deleted

## 2018-05-09 VITALS — BP 121/75 | HR 68 | Temp 98.0°F | Wt 205.5 lb

## 2018-05-09 DIAGNOSIS — Z006 Encounter for examination for normal comparison and control in clinical research program: Secondary | ICD-10-CM

## 2018-05-09 NOTE — Research (Signed)
Michael RuizJohn is here for his week 5281 HPTN 083 visit. No new complaints or medications. Adherence 90% with his oral study product. Rapid HIV non-reactive. Cabotegravir/placebo injection given (R) glute without problem. Next study visit scheduled for 05/21/18.

## 2018-05-10 LAB — COMPREHENSIVE METABOLIC PANEL
AG RATIO: 1.8 (calc) (ref 1.0–2.5)
ALT: 31 U/L (ref 9–46)
AST: 28 U/L (ref 10–40)
Albumin: 4.2 g/dL (ref 3.6–5.1)
Alkaline phosphatase (APISO): 51 U/L (ref 40–115)
BUN: 19 mg/dL (ref 7–25)
CO2: 30 mmol/L (ref 20–32)
Calcium: 9 mg/dL (ref 8.6–10.3)
Chloride: 105 mmol/L (ref 98–110)
Creat: 1.26 mg/dL (ref 0.60–1.35)
GLOBULIN: 2.4 g/dL (ref 1.9–3.7)
GLUCOSE: 79 mg/dL (ref 65–99)
Potassium: 4.3 mmol/L (ref 3.5–5.3)
SODIUM: 139 mmol/L (ref 135–146)
TOTAL PROTEIN: 6.6 g/dL (ref 6.1–8.1)
Total Bilirubin: 0.5 mg/dL (ref 0.2–1.2)

## 2018-05-10 LAB — CBC WITH DIFFERENTIAL/PLATELET
BASOS PCT: 1.1 %
Basophils Absolute: 59 cells/uL (ref 0–200)
EOS ABS: 92 {cells}/uL (ref 15–500)
Eosinophils Relative: 1.7 %
HCT: 41.9 % (ref 38.5–50.0)
HEMOGLOBIN: 14.4 g/dL (ref 13.2–17.1)
Lymphs Abs: 1787 cells/uL (ref 850–3900)
MCH: 29 pg (ref 27.0–33.0)
MCHC: 34.4 g/dL (ref 32.0–36.0)
MCV: 84.5 fL (ref 80.0–100.0)
MPV: 12.8 fL — AB (ref 7.5–12.5)
Monocytes Relative: 6.6 %
NEUTROS ABS: 3105 {cells}/uL (ref 1500–7800)
Neutrophils Relative %: 57.5 %
Platelets: 204 10*3/uL (ref 140–400)
RBC: 4.96 10*6/uL (ref 4.20–5.80)
RDW: 13.3 % (ref 11.0–15.0)
Total Lymphocyte: 33.1 %
WBC: 5.4 10*3/uL (ref 3.8–10.8)
WBCMIX: 356 {cells}/uL (ref 200–950)

## 2018-05-10 LAB — AMYLASE: AMYLASE: 56 U/L (ref 21–101)

## 2018-05-10 LAB — LIPASE: Lipase: 25 U/L (ref 7–60)

## 2018-05-10 LAB — CK: CK TOTAL: 281 U/L — AB (ref 44–196)

## 2018-05-10 LAB — HIV ANTIBODY (ROUTINE TESTING W REFLEX): HIV 1&2 Ab, 4th Generation: NONREACTIVE

## 2018-05-10 LAB — RPR: RPR: NONREACTIVE

## 2018-05-10 LAB — C. TRACHOMATIS/N. GONORRHOEAE RNA
C. trachomatis RNA, TMA: NOT DETECTED
N. gonorrhoeae RNA, TMA: NOT DETECTED

## 2018-05-10 LAB — PHOSPHORUS: Phosphorus: 2.9 mg/dL (ref 2.5–4.5)

## 2018-05-11 LAB — CT/NG RNA, TMA RECTAL
Chlamydia Trachomatis RNA: NOT DETECTED
Neisseria Gonorrhoeae RNA: NOT DETECTED

## 2018-05-21 ENCOUNTER — Encounter (INDEPENDENT_AMBULATORY_CARE_PROVIDER_SITE_OTHER): Payer: Self-pay | Admitting: *Deleted

## 2018-05-21 VITALS — BP 114/69 | HR 66 | Temp 97.9°F | Wt 206.8 lb

## 2018-05-21 DIAGNOSIS — Z006 Encounter for examination for normal comparison and control in clinical research program: Secondary | ICD-10-CM

## 2018-05-21 NOTE — Research (Signed)
Michael Duncan is here for his week 3883 HPTN 083 visit. Denied any site reaction after his last injection. No new complaints or medications. Rapid HIV non-reactive. He will return in January for his next study visit.

## 2018-05-22 LAB — COMPREHENSIVE METABOLIC PANEL
AG RATIO: 1.9 (calc) (ref 1.0–2.5)
ALBUMIN MSPROF: 4 g/dL (ref 3.6–5.1)
ALT: 19 U/L (ref 9–46)
AST: 28 U/L (ref 10–40)
Alkaline phosphatase (APISO): 41 U/L (ref 40–115)
BUN: 15 mg/dL (ref 7–25)
CHLORIDE: 107 mmol/L (ref 98–110)
CO2: 27 mmol/L (ref 20–32)
Calcium: 9.1 mg/dL (ref 8.6–10.3)
Creat: 1.03 mg/dL (ref 0.60–1.35)
GLOBULIN: 2.1 g/dL (ref 1.9–3.7)
GLUCOSE: 100 mg/dL — AB (ref 65–99)
POTASSIUM: 4.3 mmol/L (ref 3.5–5.3)
SODIUM: 141 mmol/L (ref 135–146)
TOTAL PROTEIN: 6.1 g/dL (ref 6.1–8.1)
Total Bilirubin: 0.5 mg/dL (ref 0.2–1.2)

## 2018-05-22 LAB — CBC WITH DIFFERENTIAL/PLATELET
Basophils Absolute: 59 cells/uL (ref 0–200)
Basophils Relative: 1.2 %
EOS ABS: 108 {cells}/uL (ref 15–500)
Eosinophils Relative: 2.2 %
HCT: 39.5 % (ref 38.5–50.0)
Hemoglobin: 13.5 g/dL (ref 13.2–17.1)
Lymphs Abs: 1499 cells/uL (ref 850–3900)
MCH: 28.2 pg (ref 27.0–33.0)
MCHC: 34.2 g/dL (ref 32.0–36.0)
MCV: 82.6 fL (ref 80.0–100.0)
MONOS PCT: 7.3 %
MPV: 12.6 fL — AB (ref 7.5–12.5)
Neutro Abs: 2876 cells/uL (ref 1500–7800)
Neutrophils Relative %: 58.7 %
PLATELETS: 184 10*3/uL (ref 140–400)
RBC: 4.78 10*6/uL (ref 4.20–5.80)
RDW: 13.8 % (ref 11.0–15.0)
TOTAL LYMPHOCYTE: 30.6 %
WBC: 4.9 10*3/uL (ref 3.8–10.8)
WBCMIX: 358 {cells}/uL (ref 200–950)

## 2018-05-22 LAB — LIPASE: LIPASE: 30 U/L (ref 7–60)

## 2018-05-22 LAB — CK: CK TOTAL: 332 U/L — AB (ref 44–196)

## 2018-05-22 LAB — AMYLASE: Amylase: 51 U/L (ref 21–101)

## 2018-05-22 LAB — PHOSPHORUS: Phosphorus: 2.8 mg/dL (ref 2.5–4.5)

## 2018-05-22 LAB — HIV ANTIBODY (ROUTINE TESTING W REFLEX): HIV: NONREACTIVE

## 2018-07-01 ENCOUNTER — Encounter (INDEPENDENT_AMBULATORY_CARE_PROVIDER_SITE_OTHER): Payer: BLUE CROSS/BLUE SHIELD | Admitting: *Deleted

## 2018-07-01 VITALS — BP 111/63 | HR 74 | Temp 98.2°F | Wt 203.5 lb

## 2018-07-01 DIAGNOSIS — Z006 Encounter for examination for normal comparison and control in clinical research program: Secondary | ICD-10-CM

## 2018-07-01 NOTE — Research (Addendum)
Michael Duncan is here for his week 71 HPTN 083 visit. States he had URI symptoms (low grade fever, ST, sinus congestion) over the holiday that lasted about 3 days. No other complaints or concerns verbalized.  Adherence 94% without his oral study medication. Rapid HIV non-reactive. Cabotegravir/placebo injection given (R) glute without problem. He will return in 2 weeks for his next visit.

## 2018-07-02 LAB — CBC WITH DIFFERENTIAL/PLATELET
Absolute Monocytes: 319 cells/uL (ref 200–950)
BASOS ABS: 59 {cells}/uL (ref 0–200)
BASOS PCT: 1 %
EOS PCT: 1 %
Eosinophils Absolute: 59 cells/uL (ref 15–500)
HCT: 41.3 % (ref 38.5–50.0)
HEMOGLOBIN: 14 g/dL (ref 13.2–17.1)
Lymphs Abs: 1758 cells/uL (ref 850–3900)
MCH: 28.2 pg (ref 27.0–33.0)
MCHC: 33.9 g/dL (ref 32.0–36.0)
MCV: 83.1 fL (ref 80.0–100.0)
MPV: 12.8 fL — AB (ref 7.5–12.5)
Monocytes Relative: 5.4 %
NEUTROS ABS: 3705 {cells}/uL (ref 1500–7800)
Neutrophils Relative %: 62.8 %
PLATELETS: 207 10*3/uL (ref 140–400)
RBC: 4.97 10*6/uL (ref 4.20–5.80)
RDW: 14.6 % (ref 11.0–15.0)
TOTAL LYMPHOCYTE: 29.8 %
WBC: 5.9 10*3/uL (ref 3.8–10.8)

## 2018-07-02 LAB — COMPREHENSIVE METABOLIC PANEL
AG Ratio: 1.7 (calc) (ref 1.0–2.5)
ALBUMIN MSPROF: 4 g/dL (ref 3.6–5.1)
ALT: 26 U/L (ref 9–46)
AST: 47 U/L — ABNORMAL HIGH (ref 10–40)
Alkaline phosphatase (APISO): 49 U/L (ref 40–115)
BUN: 18 mg/dL (ref 7–25)
CO2: 26 mmol/L (ref 20–32)
Calcium: 9 mg/dL (ref 8.6–10.3)
Chloride: 108 mmol/L (ref 98–110)
Creat: 1.14 mg/dL (ref 0.60–1.35)
GLUCOSE: 107 mg/dL — AB (ref 65–99)
Globulin: 2.4 g/dL (calc) (ref 1.9–3.7)
POTASSIUM: 4.2 mmol/L (ref 3.5–5.3)
Sodium: 140 mmol/L (ref 135–146)
TOTAL PROTEIN: 6.4 g/dL (ref 6.1–8.1)
Total Bilirubin: 0.5 mg/dL (ref 0.2–1.2)

## 2018-07-02 LAB — AMYLASE: Amylase: 53 U/L (ref 21–101)

## 2018-07-02 LAB — PHOSPHORUS: PHOSPHORUS: 2.9 mg/dL (ref 2.5–4.5)

## 2018-07-02 LAB — CK: CK TOTAL: 3594 U/L — AB (ref 44–196)

## 2018-07-02 LAB — LIPASE: Lipase: 27 U/L (ref 7–60)

## 2018-07-02 LAB — HIV ANTIBODY (ROUTINE TESTING W REFLEX): HIV 1&2 Ab, 4th Generation: NONREACTIVE

## 2018-07-08 ENCOUNTER — Encounter: Payer: BLUE CROSS/BLUE SHIELD | Admitting: *Deleted

## 2018-07-10 ENCOUNTER — Encounter (INDEPENDENT_AMBULATORY_CARE_PROVIDER_SITE_OTHER): Payer: BLUE CROSS/BLUE SHIELD | Admitting: *Deleted

## 2018-07-10 VITALS — BP 116/75 | HR 59 | Temp 98.2°F | Wt 203.0 lb

## 2018-07-10 DIAGNOSIS — Z006 Encounter for examination for normal comparison and control in clinical research program: Secondary | ICD-10-CM

## 2018-07-10 NOTE — Research (Signed)
Michael Duncan is here for his week 91 HPTN 083 visit. Denied any site reaction after his last injection.  CPK elevated at his last visit. He had worked out doing cardio and weight lifting for 2 hours the day before his last visit. He has refrained from exercising since his last visit. Safety labs including CPK obtained today. Rapid HIV non-reactive. Verbalized excellent adherence with oral study medicine. He will return in February for his next visit.

## 2018-07-11 LAB — CBC WITH DIFFERENTIAL/PLATELET
Absolute Monocytes: 371 cells/uL (ref 200–950)
Basophils Absolute: 58 cells/uL (ref 0–200)
Basophils Relative: 1.1 %
EOS PCT: 1.3 %
Eosinophils Absolute: 69 cells/uL (ref 15–500)
HEMATOCRIT: 42.4 % (ref 38.5–50.0)
HEMOGLOBIN: 14.6 g/dL (ref 13.2–17.1)
LYMPHS ABS: 1850 {cells}/uL (ref 850–3900)
MCH: 28.7 pg (ref 27.0–33.0)
MCHC: 34.4 g/dL (ref 32.0–36.0)
MCV: 83.5 fL (ref 80.0–100.0)
MPV: 12.8 fL — ABNORMAL HIGH (ref 7.5–12.5)
Monocytes Relative: 7 %
Neutro Abs: 2952 cells/uL (ref 1500–7800)
Neutrophils Relative %: 55.7 %
Platelets: 215 10*3/uL (ref 140–400)
RBC: 5.08 10*6/uL (ref 4.20–5.80)
RDW: 14.9 % (ref 11.0–15.0)
Total Lymphocyte: 34.9 %
WBC: 5.3 10*3/uL (ref 3.8–10.8)

## 2018-07-11 LAB — AMYLASE: Amylase: 64 U/L (ref 21–101)

## 2018-07-11 LAB — CK: Total CK: 371 U/L — ABNORMAL HIGH (ref 44–196)

## 2018-07-11 LAB — COMPREHENSIVE METABOLIC PANEL
AG RATIO: 1.6 (calc) (ref 1.0–2.5)
ALT: 31 U/L (ref 9–46)
AST: 36 U/L (ref 10–40)
Albumin: 4.1 g/dL (ref 3.6–5.1)
Alkaline phosphatase (APISO): 44 U/L (ref 40–115)
BUN: 15 mg/dL (ref 7–25)
CALCIUM: 9.1 mg/dL (ref 8.6–10.3)
CHLORIDE: 106 mmol/L (ref 98–110)
CO2: 28 mmol/L (ref 20–32)
Creat: 1.09 mg/dL (ref 0.60–1.35)
GLOBULIN: 2.5 g/dL (ref 1.9–3.7)
Glucose, Bld: 87 mg/dL (ref 65–99)
Potassium: 4.5 mmol/L (ref 3.5–5.3)
Sodium: 140 mmol/L (ref 135–146)
Total Bilirubin: 0.7 mg/dL (ref 0.2–1.2)
Total Protein: 6.6 g/dL (ref 6.1–8.1)

## 2018-07-11 LAB — LIPASE: Lipase: 29 U/L (ref 7–60)

## 2018-07-11 LAB — PHOSPHORUS: Phosphorus: 2.9 mg/dL (ref 2.5–4.5)

## 2018-07-11 LAB — HIV ANTIBODY (ROUTINE TESTING W REFLEX): HIV 1&2 Ab, 4th Generation: NONREACTIVE

## 2018-07-26 DIAGNOSIS — F5221 Male erectile disorder: Secondary | ICD-10-CM | POA: Diagnosis not present

## 2018-08-19 ENCOUNTER — Encounter (INDEPENDENT_AMBULATORY_CARE_PROVIDER_SITE_OTHER): Payer: Self-pay

## 2018-08-19 VITALS — BP 120/71 | HR 64 | Temp 98.6°F | Wt 206.0 lb

## 2018-08-19 DIAGNOSIS — Z006 Encounter for examination for normal comparison and control in clinical research program: Secondary | ICD-10-CM

## 2018-08-19 NOTE — Research (Signed)
Participant here for [id number] study visit. He has no new concerns or complaints. He is doing well with taking his study medication daily. Today's rapid HIV non-reactive. He received a new dispense of oral IP and IM injection of cabotegravir/placebo in the left glute. He is scheduled for a follow up visit on 09/02/2018.

## 2018-08-20 LAB — COMPREHENSIVE METABOLIC PANEL
AG Ratio: 1.4 (calc) (ref 1.0–2.5)
ALKALINE PHOSPHATASE (APISO): 43 U/L (ref 36–130)
ALT: 26 U/L (ref 9–46)
AST: 35 U/L (ref 10–40)
Albumin: 3.9 g/dL (ref 3.6–5.1)
BUN: 17 mg/dL (ref 7–25)
CO2: 26 mmol/L (ref 20–32)
CREATININE: 1.21 mg/dL (ref 0.60–1.35)
Calcium: 8.9 mg/dL (ref 8.6–10.3)
Chloride: 108 mmol/L (ref 98–110)
Globulin: 2.7 g/dL (calc) (ref 1.9–3.7)
Glucose, Bld: 94 mg/dL (ref 65–99)
Potassium: 4.8 mmol/L (ref 3.5–5.3)
Sodium: 140 mmol/L (ref 135–146)
Total Bilirubin: 0.6 mg/dL (ref 0.2–1.2)
Total Protein: 6.6 g/dL (ref 6.1–8.1)

## 2018-08-20 LAB — CBC WITH DIFFERENTIAL/PLATELET
ABSOLUTE MONOCYTES: 375 {cells}/uL (ref 200–950)
Basophils Absolute: 60 cells/uL (ref 0–200)
Basophils Relative: 1.2 %
EOS ABS: 80 {cells}/uL (ref 15–500)
Eosinophils Relative: 1.6 %
HCT: 41.2 % (ref 38.5–50.0)
Hemoglobin: 13.9 g/dL (ref 13.2–17.1)
Lymphs Abs: 2290 cells/uL (ref 850–3900)
MCH: 28.3 pg (ref 27.0–33.0)
MCHC: 33.7 g/dL (ref 32.0–36.0)
MCV: 83.9 fL (ref 80.0–100.0)
MONOS PCT: 7.5 %
MPV: 12.7 fL — ABNORMAL HIGH (ref 7.5–12.5)
Neutro Abs: 2195 cells/uL (ref 1500–7800)
Neutrophils Relative %: 43.9 %
Platelets: 181 10*3/uL (ref 140–400)
RBC: 4.91 10*6/uL (ref 4.20–5.80)
RDW: 13.9 % (ref 11.0–15.0)
TOTAL LYMPHOCYTE: 45.8 %
WBC: 5 10*3/uL (ref 3.8–10.8)

## 2018-08-20 LAB — HIV ANTIBODY (ROUTINE TESTING W REFLEX): HIV 1&2 Ab, 4th Generation: NONREACTIVE

## 2018-08-20 LAB — PHOSPHORUS: Phosphorus: 3.1 mg/dL (ref 2.5–4.5)

## 2018-08-20 LAB — CK: CK TOTAL: 435 U/L — AB (ref 44–196)

## 2018-08-20 LAB — LIPASE: LIPASE: 21 U/L (ref 7–60)

## 2018-08-20 LAB — AMYLASE: Amylase: 55 U/L (ref 21–101)

## 2018-09-02 ENCOUNTER — Encounter (INDEPENDENT_AMBULATORY_CARE_PROVIDER_SITE_OTHER): Payer: Self-pay | Admitting: *Deleted

## 2018-09-02 VITALS — Wt 204.0 lb

## 2018-09-02 DIAGNOSIS — Z006 Encounter for examination for normal comparison and control in clinical research program: Secondary | ICD-10-CM

## 2018-09-02 NOTE — Research (Signed)
Michael Duncan is here for his week 99 HPTN 083 visit. Denied any injection site reaction. No new complaints or medications. Rapid HIV non-reactive. Verbalized excellent adherence with his oral study medication. He will return on 10/16/17 for his next study visit.

## 2018-09-03 LAB — CBC WITH DIFFERENTIAL/PLATELET
Absolute Monocytes: 494 cells/uL (ref 200–950)
Basophils Absolute: 71 cells/uL (ref 0–200)
Basophils Relative: 1.5 %
EOS ABS: 99 {cells}/uL (ref 15–500)
EOS PCT: 2.1 %
HCT: 41.8 % (ref 38.5–50.0)
Hemoglobin: 14.3 g/dL (ref 13.2–17.1)
Lymphs Abs: 2035 cells/uL (ref 850–3900)
MCH: 28.6 pg (ref 27.0–33.0)
MCHC: 34.2 g/dL (ref 32.0–36.0)
MCV: 83.6 fL (ref 80.0–100.0)
MONOS PCT: 10.5 %
MPV: 12.7 fL — ABNORMAL HIGH (ref 7.5–12.5)
Neutro Abs: 2002 cells/uL (ref 1500–7800)
Neutrophils Relative %: 42.6 %
Platelets: 160 10*3/uL (ref 140–400)
RBC: 5 10*6/uL (ref 4.20–5.80)
RDW: 13.5 % (ref 11.0–15.0)
Total Lymphocyte: 43.3 %
WBC: 4.7 10*3/uL (ref 3.8–10.8)

## 2018-09-03 LAB — COMPREHENSIVE METABOLIC PANEL
AG Ratio: 1.4 (calc) (ref 1.0–2.5)
ALT: 25 U/L (ref 9–46)
AST: 30 U/L (ref 10–40)
Albumin: 3.9 g/dL (ref 3.6–5.1)
Alkaline phosphatase (APISO): 42 U/L (ref 36–130)
BUN: 20 mg/dL (ref 7–25)
CO2: 27 mmol/L (ref 20–32)
Calcium: 8.8 mg/dL (ref 8.6–10.3)
Chloride: 106 mmol/L (ref 98–110)
Creat: 1.15 mg/dL (ref 0.60–1.35)
Globulin: 2.7 g/dL (calc) (ref 1.9–3.7)
Glucose, Bld: 89 mg/dL (ref 65–99)
Potassium: 4.5 mmol/L (ref 3.5–5.3)
Sodium: 139 mmol/L (ref 135–146)
Total Bilirubin: 0.5 mg/dL (ref 0.2–1.2)
Total Protein: 6.6 g/dL (ref 6.1–8.1)

## 2018-09-03 LAB — HIV ANTIBODY (ROUTINE TESTING W REFLEX): HIV 1&2 Ab, 4th Generation: NONREACTIVE

## 2018-09-03 LAB — PHOSPHORUS: Phosphorus: 2.9 mg/dL (ref 2.5–4.5)

## 2018-09-03 LAB — AMYLASE: Amylase: 49 U/L (ref 21–101)

## 2018-09-03 LAB — LIPASE: LIPASE: 16 U/L (ref 7–60)

## 2018-09-03 LAB — CK: Total CK: 191 U/L (ref 44–196)

## 2018-10-16 ENCOUNTER — Other Ambulatory Visit: Payer: Self-pay

## 2018-10-16 ENCOUNTER — Encounter (INDEPENDENT_AMBULATORY_CARE_PROVIDER_SITE_OTHER): Payer: Self-pay | Admitting: *Deleted

## 2018-10-16 VITALS — BP 115/69 | HR 57 | Temp 98.0°F | Wt 206.8 lb

## 2018-10-16 DIAGNOSIS — Z006 Encounter for examination for normal comparison and control in clinical research program: Secondary | ICD-10-CM

## 2018-10-16 NOTE — Research (Signed)
Michael Duncan is here for his week 105 HPTN 083 visit. Denied any new concern or medications. Adherence 84% by pill count. He was surprised by this and stating he thought he had  only missed a couple of doses. He says that this is probably related to being out of his normal routine. He is using a phone app to help with his daily dosing. States that he may add using a pill box to help with his adherence if he continues to have problems. Rapid HIV non-reactive. Denied any acute HIV symptoms. Cabotegravir/placebo injection given (L) glute without problem. He will return in 2 weeks for his next study visit.

## 2018-10-17 ENCOUNTER — Encounter: Payer: BLUE CROSS/BLUE SHIELD | Admitting: *Deleted

## 2018-10-17 LAB — HIV ANTIBODY (ROUTINE TESTING W REFLEX): HIV 1&2 Ab, 4th Generation: NONREACTIVE

## 2018-10-17 LAB — LIPID PANEL
Cholesterol: 140 mg/dL (ref <200)
HDL: 43 mg/dL (ref >=40)
LDL Cholesterol (Calc): 81 mg/dL (calc)
Non-HDL Cholesterol (Calc): 97 mg/dL (calc) (ref <130)
Total CHOL/HDL Ratio: 3.3 (calc) (ref <5.0)
Triglycerides: 76 mg/dL (ref <150)

## 2018-10-17 LAB — URINALYSIS, ROUTINE W REFLEX MICROSCOPIC
Bilirubin Urine: NEGATIVE
Glucose, UA: NEGATIVE
Hgb urine dipstick: NEGATIVE
Ketones, ur: NEGATIVE
Leukocytes,Ua: NEGATIVE
Nitrite: NEGATIVE
Protein, ur: NEGATIVE
Specific Gravity, Urine: 1.022 (ref 1.001–1.03)
pH: 5.5 (ref 5.0–8.0)

## 2018-10-17 LAB — COMPREHENSIVE METABOLIC PANEL
AG Ratio: 1.7 (calc) (ref 1.0–2.5)
ALT: 22 U/L (ref 9–46)
AST: 32 U/L (ref 10–40)
Albumin: 3.8 g/dL (ref 3.6–5.1)
Alkaline phosphatase (APISO): 41 U/L (ref 36–130)
BUN: 19 mg/dL (ref 7–25)
CO2: 28 mmol/L (ref 20–32)
Calcium: 8.9 mg/dL (ref 8.6–10.3)
Chloride: 106 mmol/L (ref 98–110)
Creat: 1.23 mg/dL (ref 0.60–1.35)
Globulin: 2.3 g/dL (calc) (ref 1.9–3.7)
Glucose, Bld: 95 mg/dL (ref 65–99)
Potassium: 4.4 mmol/L (ref 3.5–5.3)
Sodium: 141 mmol/L (ref 135–146)
Total Bilirubin: 0.4 mg/dL (ref 0.2–1.2)
Total Protein: 6.1 g/dL (ref 6.1–8.1)

## 2018-10-17 LAB — HEPATITIS C ANTIBODY
Hepatitis C Ab: NONREACTIVE
SIGNAL TO CUT-OFF: 0.02 (ref <1.00)

## 2018-10-17 LAB — CBC WITH DIFFERENTIAL/PLATELET
Absolute Monocytes: 368 {cells}/uL (ref 200–950)
Basophils Absolute: 60 {cells}/uL (ref 0–200)
Basophils Relative: 1.3 %
Eosinophils Absolute: 170 {cells}/uL (ref 15–500)
Eosinophils Relative: 3.7 %
HCT: 39.9 % (ref 38.5–50.0)
Hemoglobin: 13.4 g/dL (ref 13.2–17.1)
Lymphs Abs: 2001 {cells}/uL (ref 850–3900)
MCH: 28.8 pg (ref 27.0–33.0)
MCHC: 33.6 g/dL (ref 32.0–36.0)
MCV: 85.8 fL (ref 80.0–100.0)
MPV: 12.5 fL (ref 7.5–12.5)
Monocytes Relative: 8 %
Neutro Abs: 2001 {cells}/uL (ref 1500–7800)
Neutrophils Relative %: 43.5 %
Platelets: 173 10*3/uL (ref 140–400)
RBC: 4.65 Million/uL (ref 4.20–5.80)
RDW: 13.8 % (ref 11.0–15.0)
Total Lymphocyte: 43.5 %
WBC: 4.6 10*3/uL (ref 3.8–10.8)

## 2018-10-17 LAB — LIPASE: Lipase: 23 U/L (ref 7–60)

## 2018-10-17 LAB — C. TRACHOMATIS/N. GONORRHOEAE RNA
C. trachomatis RNA, TMA: NOT DETECTED
N. gonorrhoeae RNA, TMA: NOT DETECTED

## 2018-10-17 LAB — AMYLASE: Amylase: 49 U/L (ref 21–101)

## 2018-10-17 LAB — RPR: RPR Ser Ql: NONREACTIVE

## 2018-10-17 LAB — CK: Total CK: 416 U/L — ABNORMAL HIGH (ref 44–196)

## 2018-10-17 LAB — PHOSPHORUS: Phosphorus: 4.3 mg/dL (ref 2.5–4.5)

## 2018-10-18 LAB — CT/NG RNA, TMA RECTAL
Chlamydia Trachomatis RNA: NOT DETECTED
Neisseria Gonorrhoeae RNA: NOT DETECTED

## 2018-10-24 ENCOUNTER — Encounter (INDEPENDENT_AMBULATORY_CARE_PROVIDER_SITE_OTHER): Payer: Self-pay | Admitting: *Deleted

## 2018-10-24 ENCOUNTER — Other Ambulatory Visit: Payer: Self-pay

## 2018-10-24 VITALS — BP 106/60 | HR 67 | Temp 98.0°F | Wt 205.8 lb

## 2018-10-24 DIAGNOSIS — Z006 Encounter for examination for normal comparison and control in clinical research program: Secondary | ICD-10-CM

## 2018-10-24 NOTE — Research (Signed)
Michael Duncan is here for his week 107 HPTN 083 visit. Denied any site reaction after his last injection. No new complaints or medications. Rapid HIV non-reactive. He will return in June for his next study visit.

## 2018-10-26 LAB — CBC WITH DIFFERENTIAL/PLATELET
Absolute Monocytes: 518 cells/uL (ref 200–950)
Basophils Absolute: 58 cells/uL (ref 0–200)
Basophils Relative: 1.2 %
Eosinophils Absolute: 130 cells/uL (ref 15–500)
Eosinophils Relative: 2.7 %
HCT: 39.7 % (ref 38.5–50.0)
Hemoglobin: 13.4 g/dL (ref 13.2–17.1)
Lymphs Abs: 2294 cells/uL (ref 850–3900)
MCH: 28.3 pg (ref 27.0–33.0)
MCHC: 33.8 g/dL (ref 32.0–36.0)
MCV: 83.9 fL (ref 80.0–100.0)
MPV: 12.7 fL — ABNORMAL HIGH (ref 7.5–12.5)
Monocytes Relative: 10.8 %
Neutro Abs: 1800 cells/uL (ref 1500–7800)
Neutrophils Relative %: 37.5 %
Platelets: 169 10*3/uL (ref 140–400)
RBC: 4.73 10*6/uL (ref 4.20–5.80)
RDW: 13.9 % (ref 11.0–15.0)
Total Lymphocyte: 47.8 %
WBC: 4.8 10*3/uL (ref 3.8–10.8)

## 2018-10-26 LAB — COMPREHENSIVE METABOLIC PANEL
AG Ratio: 1.7 (calc) (ref 1.0–2.5)
ALT: 22 U/L (ref 9–46)
AST: 34 U/L (ref 10–40)
Albumin: 4 g/dL (ref 3.6–5.1)
Alkaline phosphatase (APISO): 37 U/L (ref 36–130)
BUN: 16 mg/dL (ref 7–25)
CO2: 27 mmol/L (ref 20–32)
Calcium: 9 mg/dL (ref 8.6–10.3)
Chloride: 107 mmol/L (ref 98–110)
Creat: 1.26 mg/dL (ref 0.60–1.35)
Globulin: 2.3 g/dL (calc) (ref 1.9–3.7)
Glucose, Bld: 98 mg/dL (ref 65–99)
Potassium: 4.3 mmol/L (ref 3.5–5.3)
Sodium: 140 mmol/L (ref 135–146)
Total Bilirubin: 0.7 mg/dL (ref 0.2–1.2)
Total Protein: 6.3 g/dL (ref 6.1–8.1)

## 2018-10-26 LAB — LIPASE: Lipase: 18 U/L (ref 7–60)

## 2018-10-26 LAB — AMYLASE: Amylase: 47 U/L (ref 21–101)

## 2018-10-26 LAB — HIV ANTIBODY (ROUTINE TESTING W REFLEX): HIV 1&2 Ab, 4th Generation: NONREACTIVE

## 2018-10-26 LAB — CK: Total CK: 561 U/L — ABNORMAL HIGH (ref 44–196)

## 2018-10-26 LAB — PHOSPHORUS: Phosphorus: 4.1 mg/dL (ref 2.5–4.5)

## 2018-12-12 ENCOUNTER — Other Ambulatory Visit: Payer: Self-pay

## 2018-12-12 ENCOUNTER — Encounter (INDEPENDENT_AMBULATORY_CARE_PROVIDER_SITE_OTHER): Payer: Self-pay | Admitting: *Deleted

## 2018-12-12 VITALS — BP 115/74 | HR 58 | Temp 98.1°F | Wt 209.0 lb

## 2018-12-12 DIAGNOSIS — Z006 Encounter for examination for normal comparison and control in clinical research program: Secondary | ICD-10-CM

## 2018-12-12 NOTE — Research (Signed)
Michael Duncan is here for his week [place] visit. No new complaints or medications. Adherence 93% with his oral study medication. Rapid HIV non-reactive today. Cabotegravir/placebo injection given (R) glute without problem. Next study visit scheduled for 12/19/18.

## 2018-12-13 LAB — CBC WITH DIFFERENTIAL/PLATELET
Absolute Monocytes: 461 cells/uL (ref 200–950)
Basophils Absolute: 80 cells/uL (ref 0–200)
Basophils Relative: 1.5 %
Eosinophils Absolute: 201 cells/uL (ref 15–500)
Eosinophils Relative: 3.8 %
HCT: 41.1 % (ref 38.5–50.0)
Hemoglobin: 13.9 g/dL (ref 13.2–17.1)
Lymphs Abs: 2088 cells/uL (ref 850–3900)
MCH: 28.5 pg (ref 27.0–33.0)
MCHC: 33.8 g/dL (ref 32.0–36.0)
MCV: 84.4 fL (ref 80.0–100.0)
MPV: 12.5 fL (ref 7.5–12.5)
Monocytes Relative: 8.7 %
Neutro Abs: 2470 cells/uL (ref 1500–7800)
Neutrophils Relative %: 46.6 %
Platelets: 158 10*3/uL (ref 140–400)
RBC: 4.87 10*6/uL (ref 4.20–5.80)
RDW: 14.4 % (ref 11.0–15.0)
Total Lymphocyte: 39.4 %
WBC: 5.3 10*3/uL (ref 3.8–10.8)

## 2018-12-13 LAB — COMPREHENSIVE METABOLIC PANEL
AG Ratio: 1.7 (calc) (ref 1.0–2.5)
ALT: 18 U/L (ref 9–46)
AST: 27 U/L (ref 10–40)
Albumin: 4 g/dL (ref 3.6–5.1)
Alkaline phosphatase (APISO): 41 U/L (ref 36–130)
BUN: 23 mg/dL (ref 7–25)
CO2: 28 mmol/L (ref 20–32)
Calcium: 8.9 mg/dL (ref 8.6–10.3)
Chloride: 107 mmol/L (ref 98–110)
Creat: 1.07 mg/dL (ref 0.60–1.35)
Globulin: 2.4 g/dL (calc) (ref 1.9–3.7)
Glucose, Bld: 85 mg/dL (ref 65–99)
Potassium: 4.6 mmol/L (ref 3.5–5.3)
Sodium: 140 mmol/L (ref 135–146)
Total Bilirubin: 0.5 mg/dL (ref 0.2–1.2)
Total Protein: 6.4 g/dL (ref 6.1–8.1)

## 2018-12-13 LAB — AMYLASE: Amylase: 63 U/L (ref 21–101)

## 2018-12-13 LAB — PHOSPHORUS: Phosphorus: 3.8 mg/dL (ref 2.5–4.5)

## 2018-12-13 LAB — LIPASE: Lipase: 42 U/L (ref 7–60)

## 2018-12-13 LAB — CK: Total CK: 407 U/L — ABNORMAL HIGH (ref 44–196)

## 2018-12-13 LAB — HIV ANTIBODY (ROUTINE TESTING W REFLEX): HIV 1&2 Ab, 4th Generation: NONREACTIVE

## 2018-12-19 ENCOUNTER — Encounter (INDEPENDENT_AMBULATORY_CARE_PROVIDER_SITE_OTHER): Payer: Self-pay | Admitting: *Deleted

## 2018-12-19 ENCOUNTER — Other Ambulatory Visit: Payer: Self-pay

## 2018-12-19 VITALS — BP 132/74 | HR 64 | Temp 98.4°F | Wt 210.5 lb

## 2018-12-19 DIAGNOSIS — Z006 Encounter for examination for normal comparison and control in clinical research program: Secondary | ICD-10-CM

## 2018-12-19 NOTE — Research (Signed)
Michael Duncan is here for his week [place] visit. Denied any injection site reaction after his last visit. No new complaints or medications. Reviewed changes from the recent letter of amendment regarding study product un-blinding. Notified that he was in Truvada group (Arm B). He understands that he will continue on open-label truvada and will no longer receive an injection at study visits. Rapid HIV was non-reactive. Verbalized excellent adherence with his oral study product. He will return in August for his next study visit.

## 2018-12-20 LAB — CBC WITH DIFFERENTIAL/PLATELET
Absolute Monocytes: 394 cells/uL (ref 200–950)
Basophils Absolute: 48 cells/uL (ref 0–200)
Basophils Relative: 1 %
Eosinophils Absolute: 211 cells/uL (ref 15–500)
Eosinophils Relative: 4.4 %
HCT: 38.9 % (ref 38.5–50.0)
Hemoglobin: 13.3 g/dL (ref 13.2–17.1)
Lymphs Abs: 2030 cells/uL (ref 850–3900)
MCH: 28.7 pg (ref 27.0–33.0)
MCHC: 34.2 g/dL (ref 32.0–36.0)
MCV: 83.8 fL (ref 80.0–100.0)
MPV: 12.8 fL — ABNORMAL HIGH (ref 7.5–12.5)
Monocytes Relative: 8.2 %
Neutro Abs: 2117 cells/uL (ref 1500–7800)
Neutrophils Relative %: 44.1 %
Platelets: 155 10*3/uL (ref 140–400)
RBC: 4.64 10*6/uL (ref 4.20–5.80)
RDW: 14.4 % (ref 11.0–15.0)
Total Lymphocyte: 42.3 %
WBC: 4.8 10*3/uL (ref 3.8–10.8)

## 2018-12-20 LAB — COMPREHENSIVE METABOLIC PANEL
AG Ratio: 1.9 (calc) (ref 1.0–2.5)
ALT: 20 U/L (ref 9–46)
AST: 28 U/L (ref 10–40)
Albumin: 3.9 g/dL (ref 3.6–5.1)
Alkaline phosphatase (APISO): 40 U/L (ref 36–130)
BUN: 20 mg/dL (ref 7–25)
CO2: 26 mmol/L (ref 20–32)
Calcium: 8.9 mg/dL (ref 8.6–10.3)
Chloride: 109 mmol/L (ref 98–110)
Creat: 0.99 mg/dL (ref 0.60–1.35)
Globulin: 2.1 g/dL (calc) (ref 1.9–3.7)
Glucose, Bld: 97 mg/dL (ref 65–99)
Potassium: 4.3 mmol/L (ref 3.5–5.3)
Sodium: 140 mmol/L (ref 135–146)
Total Bilirubin: 0.7 mg/dL (ref 0.2–1.2)
Total Protein: 6 g/dL — ABNORMAL LOW (ref 6.1–8.1)

## 2018-12-20 LAB — CK: Total CK: 435 U/L — ABNORMAL HIGH (ref 44–196)

## 2018-12-20 LAB — PHOSPHORUS: Phosphorus: 3.7 mg/dL (ref 2.5–4.5)

## 2018-12-20 LAB — HIV ANTIBODY (ROUTINE TESTING W REFLEX): HIV 1&2 Ab, 4th Generation: NONREACTIVE

## 2018-12-20 LAB — LIPASE: Lipase: 27 U/L (ref 7–60)

## 2018-12-20 LAB — AMYLASE: Amylase: 51 U/L (ref 21–101)

## 2019-02-06 ENCOUNTER — Other Ambulatory Visit: Payer: Self-pay

## 2019-02-06 ENCOUNTER — Encounter (INDEPENDENT_AMBULATORY_CARE_PROVIDER_SITE_OTHER): Payer: BC Managed Care – PPO | Admitting: *Deleted

## 2019-02-06 VITALS — BP 109/69 | HR 62 | Temp 98.6°F | Wt 215.8 lb

## 2019-02-06 DIAGNOSIS — Z006 Encounter for examination for normal comparison and control in clinical research program: Secondary | ICD-10-CM

## 2019-02-06 NOTE — Research (Signed)
Michael Duncan was here for his week 53 visit for [place]. He denies any new problems or concerns. He was given Truvada todayfor his study medication and will be returning in October. He is interested on going on cabotegravir when it becomes available through the study.

## 2019-02-07 LAB — CBC WITH DIFFERENTIAL/PLATELET
Absolute Monocytes: 418 cells/uL (ref 200–950)
Basophils Absolute: 71 cells/uL (ref 0–200)
Basophils Relative: 1.4 %
Eosinophils Absolute: 250 cells/uL (ref 15–500)
Eosinophils Relative: 4.9 %
HCT: 39.6 % (ref 38.5–50.0)
Hemoglobin: 13.3 g/dL (ref 13.2–17.1)
Lymphs Abs: 1964 cells/uL (ref 850–3900)
MCH: 28.3 pg (ref 27.0–33.0)
MCHC: 33.6 g/dL (ref 32.0–36.0)
MCV: 84.3 fL (ref 80.0–100.0)
MPV: 13.1 fL — ABNORMAL HIGH (ref 7.5–12.5)
Monocytes Relative: 8.2 %
Neutro Abs: 2397 cells/uL (ref 1500–7800)
Neutrophils Relative %: 47 %
Platelets: 158 10*3/uL (ref 140–400)
RBC: 4.7 10*6/uL (ref 4.20–5.80)
RDW: 13.8 % (ref 11.0–15.0)
Total Lymphocyte: 38.5 %
WBC: 5.1 10*3/uL (ref 3.8–10.8)

## 2019-02-07 LAB — COMPREHENSIVE METABOLIC PANEL
AG Ratio: 1.8 (calc) (ref 1.0–2.5)
ALT: 23 U/L (ref 9–46)
AST: 34 U/L (ref 10–40)
Albumin: 3.9 g/dL (ref 3.6–5.1)
Alkaline phosphatase (APISO): 42 U/L (ref 36–130)
BUN: 24 mg/dL (ref 7–25)
CO2: 24 mmol/L (ref 20–32)
Calcium: 8.7 mg/dL (ref 8.6–10.3)
Chloride: 108 mmol/L (ref 98–110)
Creat: 1.19 mg/dL (ref 0.60–1.35)
Globulin: 2.2 g/dL (calc) (ref 1.9–3.7)
Glucose, Bld: 101 mg/dL — ABNORMAL HIGH (ref 65–99)
Potassium: 4.5 mmol/L (ref 3.5–5.3)
Sodium: 139 mmol/L (ref 135–146)
Total Bilirubin: 0.5 mg/dL (ref 0.2–1.2)
Total Protein: 6.1 g/dL (ref 6.1–8.1)

## 2019-02-07 LAB — LIPASE: Lipase: 26 U/L (ref 7–60)

## 2019-02-07 LAB — PHOSPHORUS: Phosphorus: 3.6 mg/dL (ref 2.5–4.5)

## 2019-02-07 LAB — HIV ANTIBODY (ROUTINE TESTING W REFLEX): HIV 1&2 Ab, 4th Generation: NONREACTIVE

## 2019-02-07 LAB — AMYLASE: Amylase: 57 U/L (ref 21–101)

## 2019-02-07 LAB — CK: Total CK: 678 U/L — ABNORMAL HIGH (ref 44–196)

## 2019-02-14 DIAGNOSIS — M25561 Pain in right knee: Secondary | ICD-10-CM | POA: Diagnosis not present

## 2019-02-14 DIAGNOSIS — M25562 Pain in left knee: Secondary | ICD-10-CM | POA: Diagnosis not present

## 2019-03-12 ENCOUNTER — Ambulatory Visit (INDEPENDENT_AMBULATORY_CARE_PROVIDER_SITE_OTHER): Payer: BC Managed Care – PPO | Admitting: Sports Medicine

## 2019-03-12 ENCOUNTER — Encounter: Payer: Self-pay | Admitting: Sports Medicine

## 2019-03-12 ENCOUNTER — Other Ambulatory Visit: Payer: Self-pay

## 2019-03-12 VITALS — BP 128/82 | Ht 73.0 in | Wt 200.0 lb

## 2019-03-12 DIAGNOSIS — M25561 Pain in right knee: Secondary | ICD-10-CM | POA: Diagnosis not present

## 2019-03-12 DIAGNOSIS — M25562 Pain in left knee: Secondary | ICD-10-CM | POA: Diagnosis not present

## 2019-03-12 MED ORDER — MELOXICAM 15 MG PO TABS: ORAL_TABLET | ORAL | 0 refills | Status: DC

## 2019-03-12 NOTE — Progress Notes (Addendum)
[facility] [address] [place], [place] [address] Phone: [phone] Fax: [phone]   Patient Name: Michael LovettJohn Sylve Date of Birth: Mar 28, 1981 Medical Record Number: [id number] Gender: male Date of Encounter: 03/12/2019  CC: Bilateral knee pain  HPI: Pt is here c/o bilateral knee pain. Pain started 4 months ago. No MOI or inciting event.  Around that time he had increased the amount of week he was squatting in the gym.  Aggravating factors include deep squat, getting in and out of the car, [place]. Alleviating factors include rest and anti-inflammatory.  He saw his PCP 1 month ago prescribed naproxen.  No radiating symptoms.No hx of trauma or injury to area in the past.  Was able to swim pain-free, but the pools have recently closed.  Is currently in a drug trial.  No family history of arthritis.  No swelling or inflammatory disease.  Current Outpatient Medications on File Prior to Visit  Medication Sig Dispense Refill  . Study - HPTN 083 - emtricitabine/tenofovir disoproxil fumarate (TRUVADA) 200-300 mg or placebo tablet Take 1 tablet by mouth daily. This could be placebo. 90 tablet 3  . amoxicillin-clavulanate (AUGMENTIN) 875-125 MG tablet TK 1 T PO BID FOR 10 DAYS  0  . doxycycline (VIBRA-TABS) 100 MG tablet Take 1 tablet (100 mg total) by mouth 2 (two) times daily. (Patient not taking: Reported on 03/12/2019) 14 tablet 0  . ibuprofen (ADVIL,MOTRIN) 600 MG tablet Take 1 tablet (600 mg total) by mouth every 6 (six) hours as needed. (Patient not taking: Reported on 03/12/2019) 30 tablet 0  . NON FORMULARY [facility]  Wart Cream-  Cimetidine 2%, Deoxy-D-Glucose 0.2%, Fluorouracil-5 5%, Salicylic Acid 15% Apply 1-2 grams to affected area 3-4 times daily Qty. 120 gm 3 refills    . Study - HPTN 083 - cabotegravir 600 mg/3 mL intramuscular injection or placebo Inject 3 mLs (600 mg total) into the muscle every 8 (eight) weeks. (Patient not taking:  Reported on 03/12/2019) 3 mL 0  . triamcinolone (NASACORT) 55 MCG/ACT AERO nasal inhaler Place into the nose.     No current facility-administered medications on file prior to visit.    No Known Allergies  Social History   Socioeconomic History  . Marital status: Married    Spouse name: Not on file  . Number of children: Not on file  . Years of education: Not on file  . Highest education level: Not on file  Occupational History  . Not on file  Social Needs  . Financial resource strain: Not on file  . Food insecurity    Worry: Not on file    Inability: Not on file  . Transportation needs    Medical: Not on file    Non-medical: Not on file  Tobacco Use  . Smoking status: Never Smoker  . Smokeless tobacco: Never Used  Substance and Sexual Activity  . Alcohol use: No  . Drug use: No  . Sexual activity: Yes    Birth control/protection: Condom    Comment: male and male partners  Lifestyle  . Physical activity    Days per week: Not on file    Minutes per session: Not on file  . Stress: Not on file  Relationships  . Social Musicianconnections    Talks on phone: Not on file    Gets together: Not on file    Attends religious service: Not on file    Active member of club or organization: Not on file  Attends meetings of clubs or organizations: Not on file    Relationship status: Not on file  . Intimate partner violence    Fear of current or ex partner: Not on file    Emotionally abused: Not on file    Physically abused: Not on file    Forced sexual activity: Not on file  Other Topics Concern  . Not on file  Social History Narrative  . Not on file    No family history on file.  BP 128/82   Ht 6\' 1"  (1.854 m)   Wt 200 lb (90.7 kg)   BMI 26.39 kg/m   ROS:  See HPI CONST: no F/C, no malaise, no fatigue MSK: See above NEURO: no numbness/tingling, no weakness, no instability SKIN: no rash, no lesions HEME: no bleeding, no bruising, no erythema  Objective: Right  knee: Normal to inspection with no erythema or effusion or obvious bony abnormalities. Palpation normal with no warmth, joint line tenderness, patellar tenderness, or condyle tenderness.  Distal quadriceps tendon with slight TTP. ROM full in flexion and extension and lower leg rotation. Ligaments with solid consistent endpoints including ACL, PCL, LCL, MCL. Negative Mcmurray's and Thessaly tests. Non painful patellar compression. Patellar glide without crepitus. Patellar and quadriceps tendons unremarkable. Hamstring and quadriceps strength is normal.  Neurovascularly intact.  Left knee: Normal to inspection with no erythema or effusion or obvious bony abnormalities. Palpation normal with no warmth, joint line tenderness, patellar tenderness, or condyle tenderness.  Quadriceps tendon is more stiff compared to right with mild TTP. ROM full in flexion and extension and lower leg rotation. Ligaments with solid consistent endpoints including ACL, PCL, LCL, MCL. Negative Mcmurray's and Thessaly tests. Non painful patellar compression. Patellar glide without crepitus. Patellar and quadriceps tendons unremarkable. Hamstring and quadriceps strength is normal.  Neurovascularly intact.  MSK bilateral Limited knee ultrasound performed,  Suprapatellar pouch visualized in long and short axis with no abnormality. Quadriceps tendon visualized in both long and short axis with small amount of fluid at insertion bilaterally, left worse than right Patellar Tendon is visualized in long and short axis without abnormality   IMPRESSION:  Bilateral quadriceps tendon strain   Assessment and Plan:  1.  Chronic bilateral knee pain  Given the chronicity of the knee pain with increasing activity, I am concerned for bilateral quadriceps tendinitis.  Our knee sleeves today did not fit appropriately, so he was given a order sheet to appropriately fit.  I would like him to wear these compression sleeves when  exercising.  We will trial Mobic for 1 week.  We have placed a referral for physical therapy to start.  We will follow-up in 4 weeks or sooner if symptoms persist.  At that time we can consider formal imaging if no improvement.  There is no association with tendon pathology and then new drug he is in a trial for.  Avoid deep squat and lower extremity exercises, unless done with therapist.   Lanier Clam, DO, ATC Sports Medicine Fellow  Addendum:  Patient seen in the office by fellow.  His history, exam, plan of care were precepted with me.  Karlton Lemon MD Kirt Boys

## 2019-03-21 DIAGNOSIS — S76191D Other specified injury of right quadriceps muscle, fascia and tendon, subsequent encounter: Secondary | ICD-10-CM | POA: Diagnosis not present

## 2019-03-21 DIAGNOSIS — M25562 Pain in left knee: Secondary | ICD-10-CM | POA: Diagnosis not present

## 2019-03-21 DIAGNOSIS — M25561 Pain in right knee: Secondary | ICD-10-CM | POA: Diagnosis not present

## 2019-03-21 DIAGNOSIS — S76192D Other specified injury of left quadriceps muscle, fascia and tendon, subsequent encounter: Secondary | ICD-10-CM | POA: Diagnosis not present

## 2019-03-24 DIAGNOSIS — S76192D Other specified injury of left quadriceps muscle, fascia and tendon, subsequent encounter: Secondary | ICD-10-CM | POA: Diagnosis not present

## 2019-03-24 DIAGNOSIS — M25561 Pain in right knee: Secondary | ICD-10-CM | POA: Diagnosis not present

## 2019-03-24 DIAGNOSIS — S76191D Other specified injury of right quadriceps muscle, fascia and tendon, subsequent encounter: Secondary | ICD-10-CM | POA: Diagnosis not present

## 2019-03-24 DIAGNOSIS — M25562 Pain in left knee: Secondary | ICD-10-CM | POA: Diagnosis not present

## 2019-03-27 DIAGNOSIS — M25562 Pain in left knee: Secondary | ICD-10-CM | POA: Diagnosis not present

## 2019-03-27 DIAGNOSIS — S76191D Other specified injury of right quadriceps muscle, fascia and tendon, subsequent encounter: Secondary | ICD-10-CM | POA: Diagnosis not present

## 2019-03-27 DIAGNOSIS — S76192D Other specified injury of left quadriceps muscle, fascia and tendon, subsequent encounter: Secondary | ICD-10-CM | POA: Diagnosis not present

## 2019-03-27 DIAGNOSIS — M25561 Pain in right knee: Secondary | ICD-10-CM | POA: Diagnosis not present

## 2019-03-31 DIAGNOSIS — S76191D Other specified injury of right quadriceps muscle, fascia and tendon, subsequent encounter: Secondary | ICD-10-CM | POA: Diagnosis not present

## 2019-03-31 DIAGNOSIS — Z0000 Encounter for general adult medical examination without abnormal findings: Secondary | ICD-10-CM | POA: Diagnosis not present

## 2019-03-31 DIAGNOSIS — S76192D Other specified injury of left quadriceps muscle, fascia and tendon, subsequent encounter: Secondary | ICD-10-CM | POA: Diagnosis not present

## 2019-03-31 DIAGNOSIS — M25562 Pain in left knee: Secondary | ICD-10-CM | POA: Diagnosis not present

## 2019-03-31 DIAGNOSIS — M25561 Pain in right knee: Secondary | ICD-10-CM | POA: Diagnosis not present

## 2019-04-03 ENCOUNTER — Encounter (INDEPENDENT_AMBULATORY_CARE_PROVIDER_SITE_OTHER): Payer: Self-pay | Admitting: *Deleted

## 2019-04-03 ENCOUNTER — Other Ambulatory Visit: Payer: Self-pay

## 2019-04-03 VITALS — BP 131/77 | HR 71 | Temp 98.1°F | Wt 210.0 lb

## 2019-04-03 DIAGNOSIS — M25562 Pain in left knee: Secondary | ICD-10-CM | POA: Diagnosis not present

## 2019-04-03 DIAGNOSIS — M25561 Pain in right knee: Secondary | ICD-10-CM | POA: Diagnosis not present

## 2019-04-03 DIAGNOSIS — Z006 Encounter for examination for normal comparison and control in clinical research program: Secondary | ICD-10-CM

## 2019-04-03 DIAGNOSIS — S76192D Other specified injury of left quadriceps muscle, fascia and tendon, subsequent encounter: Secondary | ICD-10-CM | POA: Diagnosis not present

## 2019-04-03 DIAGNOSIS — S76191D Other specified injury of right quadriceps muscle, fascia and tendon, subsequent encounter: Secondary | ICD-10-CM | POA: Diagnosis not present

## 2019-04-03 NOTE — Research (Signed)
Michael Duncan is here for his week [place] study visit. States that he has been going to PT for bilateral knee tendonitis for 3 weeks now. States some improvement with discomfort. Adherence with study product 88% by pill count. Rapid HIV non-reactive today. Next study visit scheduled for 12/3 at 10am.

## 2019-04-04 LAB — COMPREHENSIVE METABOLIC PANEL
AG Ratio: 1.7 (calc) (ref 1.0–2.5)
ALT: 25 U/L (ref 9–46)
AST: 29 U/L (ref 10–40)
Albumin: 4 g/dL (ref 3.6–5.1)
Alkaline phosphatase (APISO): 39 U/L (ref 36–130)
BUN: 21 mg/dL (ref 7–25)
CO2: 28 mmol/L (ref 20–32)
Calcium: 9.1 mg/dL (ref 8.6–10.3)
Chloride: 105 mmol/L (ref 98–110)
Creat: 1.25 mg/dL (ref 0.60–1.35)
Globulin: 2.3 g/dL (calc) (ref 1.9–3.7)
Glucose, Bld: 118 mg/dL — ABNORMAL HIGH (ref 65–99)
Potassium: 4.3 mmol/L (ref 3.5–5.3)
Sodium: 140 mmol/L (ref 135–146)
Total Bilirubin: 0.7 mg/dL (ref 0.2–1.2)
Total Protein: 6.3 g/dL (ref 6.1–8.1)

## 2019-04-04 LAB — CBC WITH DIFFERENTIAL/PLATELET
Absolute Monocytes: 400 cells/uL (ref 200–950)
Basophils Absolute: 80 cells/uL (ref 0–200)
Basophils Relative: 1.6 %
Eosinophils Absolute: 190 cells/uL (ref 15–500)
Eosinophils Relative: 3.8 %
HCT: 42.1 % (ref 38.5–50.0)
Hemoglobin: 14.2 g/dL (ref 13.2–17.1)
Lymphs Abs: 1900 cells/uL (ref 850–3900)
MCH: 29.7 pg (ref 27.0–33.0)
MCHC: 33.7 g/dL (ref 32.0–36.0)
MCV: 88.1 fL (ref 80.0–100.0)
MPV: 13.5 fL — ABNORMAL HIGH (ref 7.5–12.5)
Monocytes Relative: 8 %
Neutro Abs: 2430 cells/uL (ref 1500–7800)
Neutrophils Relative %: 48.6 %
Platelets: 182 10*3/uL (ref 140–400)
RBC: 4.78 10*6/uL (ref 4.20–5.80)
RDW: 14.5 % (ref 11.0–15.0)
Total Lymphocyte: 38 %
WBC: 5 10*3/uL (ref 3.8–10.8)

## 2019-04-04 LAB — C. TRACHOMATIS/N. GONORRHOEAE RNA
C. trachomatis RNA, TMA: NOT DETECTED
N. gonorrhoeae RNA, TMA: NOT DETECTED

## 2019-04-04 LAB — AMYLASE: Amylase: 58 U/L (ref 21–101)

## 2019-04-04 LAB — HIV ANTIBODY (ROUTINE TESTING W REFLEX): HIV 1&2 Ab, 4th Generation: NONREACTIVE

## 2019-04-04 LAB — CK: Total CK: 341 U/L — ABNORMAL HIGH (ref 44–196)

## 2019-04-04 LAB — LIPASE: Lipase: 42 U/L (ref 7–60)

## 2019-04-04 LAB — PHOSPHORUS: Phosphorus: 3.4 mg/dL (ref 2.5–4.5)

## 2019-04-04 LAB — RPR: RPR Ser Ql: NONREACTIVE

## 2019-04-07 DIAGNOSIS — S76191D Other specified injury of right quadriceps muscle, fascia and tendon, subsequent encounter: Secondary | ICD-10-CM | POA: Diagnosis not present

## 2019-04-07 DIAGNOSIS — M25562 Pain in left knee: Secondary | ICD-10-CM | POA: Diagnosis not present

## 2019-04-07 DIAGNOSIS — M25561 Pain in right knee: Secondary | ICD-10-CM | POA: Diagnosis not present

## 2019-04-07 DIAGNOSIS — S76192D Other specified injury of left quadriceps muscle, fascia and tendon, subsequent encounter: Secondary | ICD-10-CM | POA: Diagnosis not present

## 2019-04-08 LAB — CT/NG RNA, TMA RECTAL
Chlamydia Trachomatis RNA: NOT DETECTED
Neisseria Gonorrhoeae RNA: NOT DETECTED

## 2019-04-09 ENCOUNTER — Ambulatory Visit: Payer: BC Managed Care – PPO | Admitting: Sports Medicine

## 2019-04-10 DIAGNOSIS — M25562 Pain in left knee: Secondary | ICD-10-CM | POA: Diagnosis not present

## 2019-04-10 DIAGNOSIS — M25561 Pain in right knee: Secondary | ICD-10-CM | POA: Diagnosis not present

## 2019-04-10 DIAGNOSIS — S76192D Other specified injury of left quadriceps muscle, fascia and tendon, subsequent encounter: Secondary | ICD-10-CM | POA: Diagnosis not present

## 2019-04-10 DIAGNOSIS — S76191D Other specified injury of right quadriceps muscle, fascia and tendon, subsequent encounter: Secondary | ICD-10-CM | POA: Diagnosis not present

## 2019-04-15 DIAGNOSIS — S76191D Other specified injury of right quadriceps muscle, fascia and tendon, subsequent encounter: Secondary | ICD-10-CM | POA: Diagnosis not present

## 2019-04-15 DIAGNOSIS — M25561 Pain in right knee: Secondary | ICD-10-CM | POA: Diagnosis not present

## 2019-04-15 DIAGNOSIS — S76192D Other specified injury of left quadriceps muscle, fascia and tendon, subsequent encounter: Secondary | ICD-10-CM | POA: Diagnosis not present

## 2019-04-15 DIAGNOSIS — M25562 Pain in left knee: Secondary | ICD-10-CM | POA: Diagnosis not present

## 2019-05-28 ENCOUNTER — Encounter (INDEPENDENT_AMBULATORY_CARE_PROVIDER_SITE_OTHER): Payer: Self-pay | Admitting: *Deleted

## 2019-05-28 ENCOUNTER — Other Ambulatory Visit: Payer: Self-pay

## 2019-05-28 VITALS — BP 128/77 | HR 68 | Temp 98.2°F | Wt 213.0 lb

## 2019-05-28 DIAGNOSIS — Z006 Encounter for examination for normal comparison and control in clinical research program: Secondary | ICD-10-CM

## 2019-05-28 NOTE — Research (Signed)
Michael Duncan is here for his week [place] visit. No new complaints or medications. Adherence 91% by pill count. Rapid HIV non-reactive. He received 90 day supply of truvada. He will return in January for his next study visit.

## 2019-05-29 ENCOUNTER — Telehealth: Payer: Self-pay | Admitting: *Deleted

## 2019-05-29 LAB — CBC WITH DIFFERENTIAL/PLATELET
Absolute Monocytes: 355 cells/uL (ref 200–950)
Basophils Absolute: 69 cells/uL (ref 0–200)
Basophils Relative: 1.3 %
Eosinophils Absolute: 143 cells/uL (ref 15–500)
Eosinophils Relative: 2.7 %
HCT: 42.8 % (ref 38.5–50.0)
Hemoglobin: 14.7 g/dL (ref 13.2–17.1)
Lymphs Abs: 2215 cells/uL (ref 850–3900)
MCH: 30.1 pg (ref 27.0–33.0)
MCHC: 34.3 g/dL (ref 32.0–36.0)
MCV: 87.5 fL (ref 80.0–100.0)
MPV: 13 fL — ABNORMAL HIGH (ref 7.5–12.5)
Monocytes Relative: 6.7 %
Neutro Abs: 2518 cells/uL (ref 1500–7800)
Neutrophils Relative %: 47.5 %
Platelets: 179 10*3/uL (ref 140–400)
RBC: 4.89 10*6/uL (ref 4.20–5.80)
RDW: 13.9 % (ref 11.0–15.0)
Total Lymphocyte: 41.8 %
WBC: 5.3 10*3/uL (ref 3.8–10.8)

## 2019-05-29 LAB — COMPREHENSIVE METABOLIC PANEL
AG Ratio: 1.5 (calc) (ref 1.0–2.5)
ALT: 23 U/L (ref 9–46)
AST: 37 U/L (ref 10–40)
Albumin: 4 g/dL (ref 3.6–5.1)
Alkaline phosphatase (APISO): 46 U/L (ref 36–130)
BUN: 18 mg/dL (ref 7–25)
CO2: 25 mmol/L (ref 20–32)
Calcium: 9.1 mg/dL (ref 8.6–10.3)
Chloride: 105 mmol/L (ref 98–110)
Creat: 1.33 mg/dL (ref 0.60–1.35)
Globulin: 2.6 g/dL (calc) (ref 1.9–3.7)
Glucose, Bld: 100 mg/dL — ABNORMAL HIGH (ref 65–99)
Potassium: 4.4 mmol/L (ref 3.5–5.3)
Sodium: 137 mmol/L (ref 135–146)
Total Bilirubin: 0.5 mg/dL (ref 0.2–1.2)
Total Protein: 6.6 g/dL (ref 6.1–8.1)

## 2019-05-29 LAB — HIV ANTIBODY (ROUTINE TESTING W REFLEX): HIV 1&2 Ab, 4th Generation: NONREACTIVE

## 2019-05-29 LAB — PHOSPHORUS: Phosphorus: 3.7 mg/dL (ref 2.5–4.5)

## 2019-05-29 LAB — AMYLASE: Amylase: 59 U/L (ref 21–101)

## 2019-05-29 LAB — LIPASE: Lipase: 35 U/L (ref 7–60)

## 2019-05-29 LAB — CK: Total CK: 938 U/L — ABNORMAL HIGH (ref 44–196)

## 2019-05-29 NOTE — Telephone Encounter (Signed)
Saw forgot and exercised the day before his visit (cardio and weights). This is a grade 1 CPK. I can have him come in for repeat labs but it is not required by the study, just let me know. Jonell and I discussed at his visit that his CPK may come back elevated. He had no muscle soreness at the visit yesterday. He is aware that he needs to hydrate.

## 2019-05-29 NOTE — Telephone Encounter (Signed)
Will route to research. Landis Gandy, RN

## 2019-05-29 NOTE — Telephone Encounter (Signed)
-----   Message from Truman Hayward, MD sent at 05/29/2019  8:55 AM EST ----- CPK up. Needs to hydrate, refrain from exercise and rtc for labs within a week

## 2019-07-06 DIAGNOSIS — Z20828 Contact with and (suspected) exposure to other viral communicable diseases: Secondary | ICD-10-CM | POA: Diagnosis not present

## 2019-07-23 ENCOUNTER — Other Ambulatory Visit: Payer: Self-pay

## 2019-07-23 ENCOUNTER — Encounter (INDEPENDENT_AMBULATORY_CARE_PROVIDER_SITE_OTHER): Payer: Self-pay | Admitting: *Deleted

## 2019-07-23 VITALS — BP 121/70 | HR 66 | Temp 98.1°F | Wt 208.4 lb

## 2019-07-23 DIAGNOSIS — Z006 Encounter for examination for normal comparison and control in clinical research program: Secondary | ICD-10-CM

## 2019-07-23 NOTE — Research (Signed)
Michael Duncan is here for his week 145 HPTN 083 visit. States that he tested positive for Covid on 07/05/19. Symptoms included low grade fever, headache, fatigue, cough, sore throat, joint aches and diarrhea. All symptoms have since resolved. He says that his wife has remained Covid negative. No other complaints or concerns. Adherence with study product 84% by pill count. Rapid HIV non-reactive. He will return in March for his next study visit.

## 2019-07-24 LAB — COMPREHENSIVE METABOLIC PANEL
AG Ratio: 1.7 (calc) (ref 1.0–2.5)
ALT: 20 U/L (ref 9–46)
AST: 29 U/L (ref 10–40)
Albumin: 3.8 g/dL (ref 3.6–5.1)
Alkaline phosphatase (APISO): 44 U/L (ref 36–130)
BUN: 13 mg/dL (ref 7–25)
CO2: 26 mmol/L (ref 20–32)
Calcium: 8.7 mg/dL (ref 8.6–10.3)
Chloride: 107 mmol/L (ref 98–110)
Creat: 1.19 mg/dL (ref 0.60–1.35)
Globulin: 2.2 g/dL (calc) (ref 1.9–3.7)
Glucose, Bld: 107 mg/dL — ABNORMAL HIGH (ref 65–99)
Potassium: 4.2 mmol/L (ref 3.5–5.3)
Sodium: 139 mmol/L (ref 135–146)
Total Bilirubin: 0.6 mg/dL (ref 0.2–1.2)
Total Protein: 6 g/dL — ABNORMAL LOW (ref 6.1–8.1)

## 2019-07-24 LAB — CBC WITH DIFFERENTIAL/PLATELET
Absolute Monocytes: 362 cells/uL (ref 200–950)
Basophils Absolute: 61 cells/uL (ref 0–200)
Basophils Relative: 1.2 %
Eosinophils Absolute: 122 cells/uL (ref 15–500)
Eosinophils Relative: 2.4 %
HCT: 41.2 % (ref 38.5–50.0)
Hemoglobin: 14 g/dL (ref 13.2–17.1)
Lymphs Abs: 2178 cells/uL (ref 850–3900)
MCH: 29.5 pg (ref 27.0–33.0)
MCHC: 34 g/dL (ref 32.0–36.0)
MCV: 86.9 fL (ref 80.0–100.0)
MPV: 12.9 fL — ABNORMAL HIGH (ref 7.5–12.5)
Monocytes Relative: 7.1 %
Neutro Abs: 2377 cells/uL (ref 1500–7800)
Neutrophils Relative %: 46.6 %
Platelets: 149 10*3/uL (ref 140–400)
RBC: 4.74 10*6/uL (ref 4.20–5.80)
RDW: 14.3 % (ref 11.0–15.0)
Total Lymphocyte: 42.7 %
WBC: 5.1 10*3/uL (ref 3.8–10.8)

## 2019-07-24 LAB — HIV ANTIBODY (ROUTINE TESTING W REFLEX): HIV 1&2 Ab, 4th Generation: NONREACTIVE

## 2019-07-24 LAB — PHOSPHORUS: Phosphorus: 3.6 mg/dL (ref 2.5–4.5)

## 2019-07-24 LAB — AMYLASE: Amylase: 66 U/L (ref 21–101)

## 2019-07-24 LAB — CK: Total CK: 419 U/L — ABNORMAL HIGH (ref 44–196)

## 2019-07-24 LAB — LIPASE: Lipase: 76 U/L — ABNORMAL HIGH (ref 7–60)

## 2019-09-16 ENCOUNTER — Encounter (INDEPENDENT_AMBULATORY_CARE_PROVIDER_SITE_OTHER): Payer: Self-pay | Admitting: *Deleted

## 2019-09-16 ENCOUNTER — Other Ambulatory Visit: Payer: Self-pay

## 2019-09-16 VITALS — BP 120/73 | HR 60 | Temp 97.8°F | Wt 210.5 lb

## 2019-09-16 DIAGNOSIS — Z006 Encounter for examination for normal comparison and control in clinical research program: Secondary | ICD-10-CM

## 2019-09-16 NOTE — Research (Signed)
Michael Duncan is here for his Week 153 HPTN 083 visit. He moved to step 3 today and will continue on open-label truvada. He is interested in switching to cabotegravir injections once version 4.0 of the protocol is approved. No new complaints or medications. Rapid HIV non-reactive today. He will return in June for his next study visit.

## 2019-09-17 LAB — RPR: RPR Ser Ql: NONREACTIVE

## 2019-09-17 LAB — CBC WITH DIFFERENTIAL/PLATELET
Absolute Monocytes: 440 cells/uL (ref 200–950)
Basophils Absolute: 83 cells/uL (ref 0–200)
Basophils Relative: 1.5 %
Eosinophils Absolute: 160 cells/uL (ref 15–500)
Eosinophils Relative: 2.9 %
HCT: 43.6 % (ref 38.5–50.0)
Hemoglobin: 14.9 g/dL (ref 13.2–17.1)
Lymphs Abs: 2569 cells/uL (ref 850–3900)
MCH: 30.3 pg (ref 27.0–33.0)
MCHC: 34.2 g/dL (ref 32.0–36.0)
MCV: 88.6 fL (ref 80.0–100.0)
MPV: 12.7 fL — ABNORMAL HIGH (ref 7.5–12.5)
Monocytes Relative: 8 %
Neutro Abs: 2250 cells/uL (ref 1500–7800)
Neutrophils Relative %: 40.9 %
Platelets: 175 10*3/uL (ref 140–400)
RBC: 4.92 10*6/uL (ref 4.20–5.80)
RDW: 13.8 % (ref 11.0–15.0)
Total Lymphocyte: 46.7 %
WBC: 5.5 10*3/uL (ref 3.8–10.8)

## 2019-09-17 LAB — CK: Total CK: 182 U/L (ref 44–196)

## 2019-09-17 LAB — HEPATITIS C ANTIBODY
Hepatitis C Ab: NONREACTIVE
SIGNAL TO CUT-OFF: 0.02 (ref <1.00)

## 2019-09-17 LAB — COMPREHENSIVE METABOLIC PANEL
AG Ratio: 1.4 (calc) (ref 1.0–2.5)
ALT: 24 U/L (ref 9–46)
AST: 27 U/L (ref 10–40)
Albumin: 3.7 g/dL (ref 3.6–5.1)
Alkaline phosphatase (APISO): 43 U/L (ref 36–130)
BUN: 17 mg/dL (ref 7–25)
CO2: 22 mmol/L (ref 20–32)
Calcium: 8.9 mg/dL (ref 8.6–10.3)
Chloride: 107 mmol/L (ref 98–110)
Creat: 1.07 mg/dL (ref 0.60–1.35)
Globulin: 2.6 g/dL (calc) (ref 1.9–3.7)
Glucose, Bld: 91 mg/dL (ref 65–99)
Potassium: 4.5 mmol/L (ref 3.5–5.3)
Sodium: 139 mmol/L (ref 135–146)
Total Bilirubin: 0.6 mg/dL (ref 0.2–1.2)
Total Protein: 6.3 g/dL (ref 6.1–8.1)

## 2019-09-17 LAB — HIV ANTIBODY (ROUTINE TESTING W REFLEX): HIV 1&2 Ab, 4th Generation: NONREACTIVE

## 2019-09-17 LAB — AMYLASE: Amylase: 79 U/L (ref 21–101)

## 2019-09-17 LAB — C. TRACHOMATIS/N. GONORRHOEAE RNA
C. trachomatis RNA, TMA: NOT DETECTED
N. gonorrhoeae RNA, TMA: NOT DETECTED

## 2019-09-17 LAB — URINALYSIS, ROUTINE W REFLEX MICROSCOPIC
Bilirubin Urine: NEGATIVE
Glucose, UA: NEGATIVE
Hgb urine dipstick: NEGATIVE
Ketones, ur: NEGATIVE
Leukocytes,Ua: NEGATIVE
Nitrite: NEGATIVE
Protein, ur: NEGATIVE
Specific Gravity, Urine: 1.02 (ref 1.001–1.03)
pH: 6.5 (ref 5.0–8.0)

## 2019-09-17 LAB — LIPASE: Lipase: 38 U/L (ref 7–60)

## 2019-09-17 LAB — PHOSPHORUS: Phosphorus: 4.3 mg/dL (ref 2.5–4.5)

## 2019-09-21 LAB — CT/NG RNA, TMA RECTAL
Chlamydia Trachomatis RNA: NOT DETECTED
Neisseria Gonorrhoeae RNA: NOT DETECTED

## 2019-11-05 DIAGNOSIS — G44209 Tension-type headache, unspecified, not intractable: Secondary | ICD-10-CM | POA: Diagnosis not present

## 2019-12-09 ENCOUNTER — Other Ambulatory Visit: Payer: Self-pay

## 2019-12-09 ENCOUNTER — Encounter (INDEPENDENT_AMBULATORY_CARE_PROVIDER_SITE_OTHER): Payer: BC Managed Care – PPO | Admitting: *Deleted

## 2019-12-09 VITALS — BP 115/69 | HR 71 | Temp 98.4°F | Wt 213.0 lb

## 2019-12-09 DIAGNOSIS — Z006 Encounter for examination for normal comparison and control in clinical research program: Secondary | ICD-10-CM

## 2019-12-09 MED ORDER — STUDY - HPTN 083 (STEP 4, OPEN-LABEL) - CABOTEGRAVIR 600MG/3ML INJECTION (PI-VAN DAM): 600.0000 mg | INJECTION | INTRAMUSCULAR | Status: DC

## 2019-12-09 NOTE — Research (Signed)
Michael Duncan is here for his HPTN study visit. Informed consent for version 4.0 of the protocol was reviewed/explained. Risk, benefits, responsibilities and other options were discussed. He verbalized understanding and signed the informed consent witnessed by me. States that he would like to switch to cabotegravir and go directly to injection today. Reviewed possible adverse reactions, including possible injection site reactions. Rapid HIV was non-reactive today. Cabotegravir injection given (L) glute without problem. He will return in July for his next study visit and injection.

## 2019-12-11 LAB — HIV ANTIBODY (ROUTINE TESTING W REFLEX): HIV 1&2 Ab, 4th Generation: NONREACTIVE

## 2019-12-11 LAB — HEPATIC FUNCTION PANEL
AG Ratio: 1.8 (calc) (ref 1.0–2.5)
ALT: 18 U/L (ref 9–46)
AST: 22 U/L (ref 10–40)
Albumin: 3.9 g/dL (ref 3.6–5.1)
Alkaline phosphatase (APISO): 44 U/L (ref 36–130)
Bilirubin, Direct: 0.2 mg/dL (ref 0.0–0.2)
Globulin: 2.2 g/dL (calc) (ref 1.9–3.7)
Indirect Bilirubin: 0.7 mg/dL (calc) (ref 0.2–1.2)
Total Bilirubin: 0.9 mg/dL (ref 0.2–1.2)
Total Protein: 6.1 g/dL (ref 6.1–8.1)

## 2019-12-11 LAB — CREATININE, SERUM: Creat: 1.1 mg/dL (ref 0.60–1.35)

## 2019-12-11 LAB — HIV-1 RNA QUANT-NO REFLEX-BLD
HIV 1 RNA Quant: <20 copies/mL
HIV-1 RNA Quant, Log: <1.3 Log copies/mL

## 2020-01-06 ENCOUNTER — Encounter: Payer: BC Managed Care – PPO | Admitting: *Deleted

## 2020-01-08 ENCOUNTER — Other Ambulatory Visit: Payer: Self-pay

## 2020-01-08 ENCOUNTER — Encounter (INDEPENDENT_AMBULATORY_CARE_PROVIDER_SITE_OTHER): Payer: Self-pay | Admitting: *Deleted

## 2020-01-08 VITALS — BP 118/72 | HR 57 | Temp 98.0°F | Wt 214.0 lb

## 2020-01-08 DIAGNOSIS — Z006 Encounter for examination for normal comparison and control in clinical research program: Secondary | ICD-10-CM

## 2020-01-08 NOTE — Research (Signed)
Michael Duncan is here for his Step 4C Week 0 HPTN 083 study visit. Denied any injection site reaction after his last visit. No new complaints or medications. Rapid HIV non-reactive today. Cabotegravir injection given (L) glute without problem. He will return in September for his next study visit and injection.

## 2020-01-10 LAB — HEPATIC FUNCTION PANEL
AG Ratio: 1.5 (calc) (ref 1.0–2.5)
ALT: 22 U/L (ref 9–46)
AST: 24 U/L (ref 10–40)
Albumin: 3.8 g/dL (ref 3.6–5.1)
Alkaline phosphatase (APISO): 40 U/L (ref 36–130)
Bilirubin, Direct: 0.1 mg/dL (ref 0.0–0.2)
Globulin: 2.5 g/dL (calc) (ref 1.9–3.7)
Indirect Bilirubin: 0.4 mg/dL (calc) (ref 0.2–1.2)
Total Bilirubin: 0.5 mg/dL (ref 0.2–1.2)
Total Protein: 6.3 g/dL (ref 6.1–8.1)

## 2020-01-10 LAB — CREATININE, SERUM: Creat: 1.22 mg/dL (ref 0.60–1.35)

## 2020-01-10 LAB — HIV-1 RNA QUANT-NO REFLEX-BLD
HIV 1 RNA Quant: <20 copies/mL
HIV-1 RNA Quant, Log: <1.3 Log copies/mL

## 2020-01-10 LAB — HIV ANTIBODY (ROUTINE TESTING W REFLEX): HIV 1&2 Ab, 4th Generation: NONREACTIVE

## 2020-02-13 DIAGNOSIS — D1722 Benign lipomatous neoplasm of skin and subcutaneous tissue of left arm: Secondary | ICD-10-CM | POA: Diagnosis not present

## 2020-02-13 DIAGNOSIS — Z23 Encounter for immunization: Secondary | ICD-10-CM | POA: Diagnosis not present

## 2020-02-25 DIAGNOSIS — J342 Deviated nasal septum: Secondary | ICD-10-CM | POA: Diagnosis not present

## 2020-02-25 DIAGNOSIS — J343 Hypertrophy of nasal turbinates: Secondary | ICD-10-CM | POA: Diagnosis not present

## 2020-03-04 ENCOUNTER — Other Ambulatory Visit: Payer: Self-pay

## 2020-03-04 ENCOUNTER — Encounter (INDEPENDENT_AMBULATORY_CARE_PROVIDER_SITE_OTHER): Payer: Self-pay | Admitting: *Deleted

## 2020-03-04 VITALS — BP 129/84 | HR 75 | Temp 98.3°F | Wt 216.2 lb

## 2020-03-04 DIAGNOSIS — Z006 Encounter for examination for normal comparison and control in clinical research program: Secondary | ICD-10-CM

## 2020-03-04 NOTE — Research (Signed)
Michael Duncan seen today for his Step 4 Week 8 HPTN 083 study visit. Denied any injection site reactions. No new complaints or medications. He has received both doses of the [employer] Covid-19 vaccine. Rapid HIV non-reactive today. Cabotegravir injection given (L) glute without problem. He will return in November for his next study visit and injection.

## 2020-03-06 LAB — HIV-1 RNA QUANT-NO REFLEX-BLD
HIV 1 RNA Quant: <20 Copies/mL
HIV-1 RNA Quant, Log: <1.3 Log cps/mL

## 2020-03-06 LAB — HIV ANTIBODY (ROUTINE TESTING W REFLEX): HIV 1&2 Ab, 4th Generation: NONREACTIVE

## 2020-04-05 DIAGNOSIS — Z13220 Encounter for screening for lipoid disorders: Secondary | ICD-10-CM | POA: Diagnosis not present

## 2020-04-05 DIAGNOSIS — Z0000 Encounter for general adult medical examination without abnormal findings: Secondary | ICD-10-CM | POA: Diagnosis not present

## 2020-04-05 DIAGNOSIS — Z23 Encounter for immunization: Secondary | ICD-10-CM | POA: Diagnosis not present

## 2020-04-19 ENCOUNTER — Other Ambulatory Visit: Payer: BC Managed Care – PPO

## 2020-04-19 DIAGNOSIS — Z20822 Contact with and (suspected) exposure to covid-19: Secondary | ICD-10-CM | POA: Diagnosis not present

## 2020-04-20 LAB — SARS-COV-2, NAA 2 DAY TAT

## 2020-04-20 LAB — NOVEL CORONAVIRUS, NAA: SARS-CoV-2, NAA: NOT DETECTED

## 2020-04-29 ENCOUNTER — Encounter (INDEPENDENT_AMBULATORY_CARE_PROVIDER_SITE_OTHER): Payer: Self-pay | Admitting: *Deleted

## 2020-04-29 ENCOUNTER — Other Ambulatory Visit: Payer: Self-pay

## 2020-04-29 VITALS — BP 110/70 | HR 60 | Temp 97.9°F | Wt 224.0 lb

## 2020-04-29 DIAGNOSIS — Z006 Encounter for examination for normal comparison and control in clinical research program: Secondary | ICD-10-CM

## 2020-04-29 NOTE — Research (Signed)
Michael Duncan seen today for his Step 4C week 16 HPTN 083 study visit. No new complaints or medications. Denied any injection site reactions after his last injection. Rapid HIV non-reactive today Cabotegravir injection given (R) glute without problem. He will return in January for his next study visit and injection.

## 2020-05-02 LAB — HIV ANTIBODY (ROUTINE TESTING W REFLEX): HIV 1&2 Ab, 4th Generation: NONREACTIVE

## 2020-05-02 LAB — HIV-1 RNA QUANT-NO REFLEX-BLD
HIV 1 RNA Quant: <20 Copies/mL
HIV-1 RNA Quant, Log: <1.3 Log cps/mL

## 2020-06-28 ENCOUNTER — Other Ambulatory Visit: Payer: Self-pay

## 2020-06-28 ENCOUNTER — Encounter (HOSPITAL_BASED_OUTPATIENT_CLINIC_OR_DEPARTMENT_OTHER): Payer: Self-pay

## 2020-06-28 ENCOUNTER — Emergency Department (HOSPITAL_BASED_OUTPATIENT_CLINIC_OR_DEPARTMENT_OTHER): Payer: BC Managed Care – PPO

## 2020-06-28 ENCOUNTER — Emergency Department (HOSPITAL_BASED_OUTPATIENT_CLINIC_OR_DEPARTMENT_OTHER)
Admission: EM | Admit: 2020-06-28 | Discharge: 2020-06-28 | Disposition: A | Discharge status: Home / Self Care | Payer: BC Managed Care – PPO | Attending: Emergency Medicine | Admitting: Emergency Medicine

## 2020-06-28 DIAGNOSIS — G510 Bell's palsy: Secondary | ICD-10-CM | POA: Diagnosis not present

## 2020-06-28 DIAGNOSIS — R509 Fever, unspecified: Secondary | ICD-10-CM | POA: Insufficient documentation

## 2020-06-28 DIAGNOSIS — R29810 Facial weakness: Secondary | ICD-10-CM | POA: Diagnosis not present

## 2020-06-28 DIAGNOSIS — R9431 Abnormal electrocardiogram [ECG] [EKG]: Secondary | ICD-10-CM | POA: Diagnosis not present

## 2020-06-28 LAB — CBC WITH DIFFERENTIAL/PLATELET
Abs Immature Granulocytes: 0.02 10*3/uL (ref 0.00–0.07)
Basophils Absolute: 0.1 10*3/uL (ref 0.0–0.1)
Basophils Relative: 1 %
Eosinophils Absolute: 0.1 10*3/uL (ref 0.0–0.5)
Eosinophils Relative: 2 %
HCT: 44.9 % (ref 39.0–52.0)
Hemoglobin: 15.5 g/dL (ref 13.0–17.0)
Immature Granulocytes: 0 %
Lymphocytes Relative: 48 %
Lymphs Abs: 3.3 10*3/uL (ref 0.7–4.0)
MCH: 31.3 pg (ref 26.0–34.0)
MCHC: 34.5 g/dL (ref 30.0–36.0)
MCV: 90.7 fL (ref 80.0–100.0)
Monocytes Absolute: 0.6 10*3/uL (ref 0.1–1.0)
Monocytes Relative: 9 %
Neutro Abs: 2.7 10*3/uL (ref 1.7–7.7)
Neutrophils Relative %: 40 %
Platelets: 150 10*3/uL (ref 150–400)
RBC: 4.95 MIL/uL (ref 4.22–5.81)
RDW: 13.2 % (ref 11.5–15.5)
WBC: 6.8 10*3/uL (ref 4.0–10.5)
nRBC: 0 % (ref 0.0–0.2)

## 2020-06-28 LAB — BASIC METABOLIC PANEL
Anion gap: 8 (ref 5–15)
BUN: 17 mg/dL (ref 6–20)
CO2: 24 mmol/L (ref 22–32)
Calcium: 8.7 mg/dL — ABNORMAL LOW (ref 8.9–10.3)
Chloride: 106 mmol/L (ref 98–111)
Creatinine, Ser: 1.12 mg/dL (ref 0.61–1.24)
GFR, Estimated: >60 mL/min (ref >60)
Glucose, Bld: 91 mg/dL (ref 70–99)
Potassium: 3.8 mmol/L (ref 3.5–5.1)
Sodium: 138 mmol/L (ref 135–145)

## 2020-06-28 LAB — CBG MONITORING, ED: Glucose-Capillary: 86 mg/dL (ref 70–99)

## 2020-06-28 MED ORDER — POLYVINYL ALCOHOL 1.4 % OP SOLN
2.0000 [drp] | OPHTHALMIC | Status: DC
  Filled 2020-06-28: qty 15

## 2020-06-28 MED ORDER — PREDNISONE 50 MG PO TABS
60.0000 mg | ORAL_TABLET | Freq: Every day | ORAL | Status: DC
  Administered 2020-06-28: 60 mg via ORAL
  Filled 2020-06-28: qty 1

## 2020-06-28 MED ORDER — VALACYCLOVIR HCL 1 G PO TABS: 1000.0000 mg | ORAL_TABLET | Freq: Three times a day (TID) | ORAL | 0 refills | Status: DC

## 2020-06-28 MED ORDER — PREDNISONE 50 MG PO TABS: 60.0000 mg | ORAL_TABLET | Freq: Once | ORAL | Status: DC

## 2020-06-28 MED ORDER — PREDNISONE 20 MG PO TABS: ORAL_TABLET | ORAL | 0 refills | Status: DC

## 2020-06-28 MED ORDER — ARTIFICIAL TEARS OPHTHALMIC OINT: TOPICAL_OINTMENT | Freq: Every day | OPHTHALMIC | Status: DC

## 2020-06-28 MED ORDER — VALACYCLOVIR HCL 500 MG PO TABS
1000.0000 mg | ORAL_TABLET | Freq: Three times a day (TID) | ORAL | Status: DC
Start: 2020-06-28 — End: 2020-06-28
  Administered 2020-06-28: 1000 mg via ORAL
  Filled 2020-06-28: qty 2

## 2020-06-28 MED ORDER — ARTIFICIAL TEARS OPHTHALMIC OINT: TOPICAL_OINTMENT | OPHTHALMIC | 0 refills | Status: DC | PRN

## 2020-06-28 NOTE — ED Notes (Signed)
Patient transported to CT 

## 2020-06-28 NOTE — Consult Note (Signed)
[facility] Tele[facility]icine Consult   Requesting Provider: Susy Duncan Consult Participants: Nurse Michael Duncan, patient, myself Location of the provider: [facility] Location of the patient: [facility]  This consult was provided via telemedicine with 2-way video and audio communication. The patient/family was informed that care would be provided in this way and agreed to receive care in this manner.    Chief Complaint: Right facial droop and weak eyelid closure  HPI: This is a 40 year old otherwise healthy non-smoking man.  He reports that at about 8 PM when he was driving he noticed that the right side of his face felt a little different when he left something on the radio.  That night his wife felt that his left eye was blinking more slowly than his right (appears to been an issue of left/right confusion as his right eye is much more noticeably slow on my evaluation).  He reports that when he woke up in the morning he generally felt weaker on that side of his face, and does have some slight difficulty raising his right eyebrow as well.  On review of systems, he denies any recent infections, any rashes, any ear pain, double vision (has some blurry vision that is baseline), changes in his hearing, issues with speech or swallow, changes in taste, any other focal weakness or numbness.  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  He denies any recent signs or symptoms of infection, including any bowel or bladder issues.  Regarding COVID-19 vaccination, he notes he had Pfizer vaccination back in March both doses, and [place] booster shot 1 week before Thanksgiving.   Regarding family history and past medical history, he reports his sister had a stroke in her mid 61s related to vitamin K deficiency.  He denies any personal history of blood clotting disorder.  He  denies any family history of Bell's palsy.  He reports his mom and sister have migraine headaches, and his mother has depression.  He denies any personal psychiatric history.  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  Affect appropriate.  Cooperative.  Assesses information provided and synthesizes it well.   Cranial nerve examination is notable for no diplopia on lateral gaze, full external ocular movements, pupils equal round reactive to light as tested by nursing, intact facial sensation as tested by nursing, intact taste sensation bilaterally as tested by nursing, right facial droop including weak eye closure and subtle right brow elevation reduction, tongue midline  Motor examination is notable for no pronator drift, intact gait  1A: Level of Consciousness - 0 1B: Ask Month and Age - 0 1C: 'Blink Eyes' & 'Squeeze Hands' - 0 2: Test Horizontal Extraocular Movements - 0 3: Test Visual Fields - 0 4: Test Facial Palsy - 2 5A:  Test Left Arm Motor Drift - 0 5B: Test Right Arm Motor Drift - 0 6A: Test Left Leg Motor Drift - 0 6B: Test Right Leg Motor Drift - 0 7: Test Limb Ataxia - 0 8: Test Sensation - 0 9: Test Language/Aphasia- 0 10: Test Dysarthria - 0 11: Test Extinction/Inattention - 0 NIHSS score: 0   Imaging Reviewed:  Head CT w/o acute intracranial process, normal HCT  Labs reviewed in epic and pertinent values follow: Unremarkable CBC and CMP   Assessment: This is a 40 year old gentleman  with no vascular risk factors presenting with isolated right facial weakness that developed gradually, classic timeline for Bell's palsy.  While the brow involvement is subtle, I am able to appreciate some on my examination and the patient concurs that the movement upwards of his eyebrow is slightly slow on the right.  Given his history of HSV and VZV, and low risk of valacyclovir, will treat with valacyclovir as well as prednisone.  Recommendations:  - 60 mg prednisone daily x 7 days - Valacyclovir 1000 mg TID x 1 week - Artifical tears q4hr while awake, ointment and moisture chamber at night for eye care - Ophthalmology follow-up appointment for eye care  - Follow up with [facility] in 1-2 months  - Strict return precautions for new symptoms, patient counseled that current symptoms may progress in the next 24 hours but then should gradually recover in 3-6 months.   This patient is receiving care for possible acute neurological changes. There was 60 minutes of care by this provider at the time of service, including time for direct evaluation via telemedicine, review of medical records, imaging studies and discussion of findings with providers, the patient and/or family.  Michael Dare MD-PhD  [facility] [phone]    If 7pm- 7am, please page neurology on call as listed in AMION.

## 2020-06-28 NOTE — ED Provider Notes (Signed)
40 yo M with a chief complaints of one-sided facial droop. Isolated to the thought to be Bell's palsy but had intact for head movement. Neurology was consulted and were awaiting neurology consult. I received the patient in signout from Dr. Bernette Mayers. Patient evaluated by the neurologist, Dr. Iver Nestle. Felt to be Bell's palsy. Recommended steroids and antiviral medications. Follow-up with ophthalmology and neurology in the office.   Melene Plan, DO 06/28/20 1628

## 2020-06-28 NOTE — ED Notes (Signed)
Cbg 86, EKG obtained and pt placed on cardiac monitor.

## 2020-06-28 NOTE — ED Triage Notes (Addendum)
Pt reports "feeling smile was off while driving to [facility]" ~7SJ yesterday-states feels "paralysis" to left side of face that feels slightly worse-denies change in speech/weakness-NAD-steady gait to tx room 14

## 2020-06-28 NOTE — ED Provider Notes (Signed)
[facility] EMERGENCY DEPARTMENT Provider Note  CSN: [id number] Arrival date & time: 06/28/20 1232    History Chief Complaint  Patient presents with  . Facial Droop    HPI  Michael Duncan is a 40 y.o. male with no significant PMH reports he noticed some right facial droop starting yesterday evening (he noticed asymmetric which he attributed to left sided droop but in fact it is his right side that isn't moving as well). He denies any headaches, found to have low grade fever of unclear etiology. No arm or leg weakness/numbness, no difficulty walking. No recent illness. He is on an experimental bimonthly HIV pre-exposure prophylaxis treatment due to his partner's HIV status. He is not himself positive.    History reviewed. No pertinent past medical history.  History reviewed. No pertinent surgical history.  No family history on file.  Social History   Tobacco Use  . Smoking status: Never Smoker  . Smokeless tobacco: Never Used  Vaping Use  . Vaping Use: Never used  Substance Use Topics  . Alcohol use: No  . Drug use: No     Home Medications Prior to Admission medications   Medication Sig Start Date End Date Taking? Authorizing Provider  amoxicillin-clavulanate (AUGMENTIN) 875-125 MG tablet TK 1 T PO BID FOR 10 DAYS 12/03/17   [provider]  doxycycline (VIBRA-TABS) 100 MG tablet Take 1 tablet (100 mg total) by mouth 2 (two) times daily. Patient not taking: Reported on 03/12/2019 11/19/17   Daiva Eves, Lisette Grinder, MD  ibuprofen (ADVIL,MOTRIN) 600 MG tablet Take 1 tablet (600 mg total) by mouth every 6 (six) hours as needed. Patient not taking: Reported on 03/12/2019 01/24/18   Derwood Kaplan, MD  meloxicam (MOBIC) 15 MG tablet Take 1 tablet daily with food for 7 days. Then take as needed. 03/12/19   Maneen, Dominic, DO  NON FORMULARY [facility]  Wart Cream-  Cimetidine 2%, Deoxy-D-Glucose 0.2%, Fluorouracil-5 5%, Salicylic Acid 15% Apply 1-2 grams to  affected area 3-4 times daily Qty. 120 gm 3 refills    [provider]  Study - HPTN 083 (Step 4, open-label) - cabotegravir 600 mg/3 mL intramuscular injection (PI-Van Dam) Inject 3 mLs (600 mg total) into the muscle every 8 (eight) weeks. 12/09/19   Randall Hiss, MD  triamcinolone (NASACORT) 55 MCG/ACT AERO nasal inhaler Place into the nose.    [provider]     Allergies    Patient has no known allergies.   Review of Systems   Review of Systems A comprehensive review of systems was completed and negative except as noted in HPI.    Physical Exam BP 135/70 (BP Location: Left Arm)   Pulse 65   Temp (!) 100.4 F (38 C) (Oral)   Resp 18   Ht 6\' 1"  (1.854 m)   Wt 99.8 kg   SpO2 99%   BMI 29.03 kg/m   Physical Exam Vitals and nursing note reviewed.  Constitutional:      Appearance: Normal appearance.  HENT:     Head: Normocephalic and atraumatic.     Nose: Nose normal.     Mouth/Throat:     Mouth: Mucous membranes are moist.  Eyes:     Extraocular Movements: Extraocular movements intact.     Conjunctiva/sclera: Conjunctivae normal.  Cardiovascular:     Rate and Rhythm: Normal rate.  Pulmonary:     Effort: Pulmonary effort is normal.     Breath sounds: Normal breath sounds.  Abdominal:     General: Abdomen is flat.     Palpations: Abdomen is soft.     Tenderness: There is no abdominal tenderness.  Musculoskeletal:        General: No swelling. Normal range of motion.     Cervical back: Neck supple.  Skin:    General: Skin is warm and dry.  Neurological:     Mental Status: He is alert.     Cranial Nerves: Cranial nerve deficit present.     Sensory: No sensory deficit.     Motor: No weakness.     Coordination: Coordination normal.     Gait: Gait normal.     Comments: R facial droop with some flattened forehead at rest, but moves symmetrically with eyebrow raise. No tongue deviation.  Psychiatric:        Mood and Affect: Mood normal.       ED Results / Procedures / Treatments   Labs (all labs ordered are listed, but only abnormal results are displayed) Labs Reviewed  BASIC METABOLIC PANEL - Abnormal; Notable for the following components:      Result Value   Calcium 8.7 (*)    All other components within normal limits  CBC WITH DIFFERENTIAL/PLATELET  CBG MONITORING, ED    EKG EKG Interpretation  Date/Time:  Monday June 28 2020 12:46:15 EST Ventricular Rate:  65 PR Interval:    QRS Duration: 99 QT Interval:  434 QTC Calculation: 452 R Axis:   45 Text Interpretation: Sinus rhythm ST elev, probable normal early repol pattern No old tracing to compare Confirmed by [name] [phone]) on 06/28/2020 1:00:10 PM    Radiology CT Head Wo Contrast  Result Date: 06/28/2020 CLINICAL DATA:  Left facial weakness EXAM: CT HEAD WITHOUT CONTRAST TECHNIQUE: Contiguous axial images were obtained from the base of the skull through the vertex without intravenous contrast. COMPARISON:  None. FINDINGS: Brain: The ventricles and sulci are normal in size and configuration. There is no intracranial mass, hemorrhage, extra-axial fluid collection, or midline shift. Brain parenchyma appears unremarkable. No evident acute infarct. Vascular: No hyperdense vessel.  No evident vascular calcification. Skull: No hyperdense vessel. No arterial vascular calcification evident. Sinuses/Orbits: Visualized paranasal sinuses are clear. Visualized orbits appear symmetric bilaterally. Other: Mastoid air cells are clear. IMPRESSION: Study within normal limits. Electronically Signed   By: Bretta Bang III M.D.   On: 06/28/2020 13:29    Procedures Procedures  Medications Ordered in the ED Medications - No data to display   MDM Rules/Calculators/A&P MDM Symptoms likely due to incomplete Bell's palsy but given forehead sparing, will check basic labs and CT. Plan discussion with Neurology. Fever of unclear etiology, he is not having any  current infectious symptoms. ED Course  I have reviewed the triage vital signs and the nursing notes.  Pertinent labs & imaging results that were available during my care of the patient were reviewed by me and considered in my medical decision making (see chart for details).  Clinical Course as of 06/29/20 0709  Mon Jun 28, 2020  1337 CT and labs are normal. Temp is now normal. Unsure if initial reading was a valid number.  [CS]  1348 Spoke with Dr. Iver Nestle, Teleneuro by Secure Chat. She will see the patient and make recommendations.   [CS]  1509 Care of the patient signed out to Dr. Adela Lank at shift change pending Neuro consult.  [CS]    Clinical Course User Index [CS] Pollyann Savoy, MD  Final Clinical Impression(s) / ED Diagnoses Final diagnoses:  None    Rx / DC Orders ED Discharge Orders    None       Truddie Hidden, MD 06/29/20 [phone]

## 2020-07-01 ENCOUNTER — Other Ambulatory Visit: Payer: Self-pay

## 2020-07-01 ENCOUNTER — Encounter (INDEPENDENT_AMBULATORY_CARE_PROVIDER_SITE_OTHER): Payer: Self-pay | Admitting: *Deleted

## 2020-07-01 VITALS — BP 114/72 | HR 74 | Temp 98.3°F | Wt 226.5 lb

## 2020-07-01 DIAGNOSIS — Z006 Encounter for examination for normal comparison and control in clinical research program: Secondary | ICD-10-CM

## 2020-07-01 NOTE — Research (Signed)
Michael Duncan seen today for his Step 4C Week 24 HPTN 083 study visit. Seen in ED Monday for facila drooping/weakness, diagnosed with Bell's Palsy. Currently on prednisone and Valtrex. Feels symptoms are currently stable and will continue to monitor. Denied any problems after his last study injection. Denied any site reactions. Rapid HIV non-reactive today. Cabotegravir injection given (L) glute without problem. He will return in February for his next study visit and injection.

## 2020-07-02 LAB — C. TRACHOMATIS/N. GONORRHOEAE RNA
C. trachomatis RNA, TMA: NOT DETECTED
N. gonorrhoeae RNA, TMA: NOT DETECTED

## 2020-07-03 LAB — CT/NG RNA, TMA RECTAL
Chlamydia Trachomatis RNA: NOT DETECTED
Neisseria Gonorrhoeae RNA: NOT DETECTED

## 2020-07-08 LAB — HEPATIC FUNCTION PANEL
AG Ratio: 1.6 (calc) (ref 1.0–2.5)
ALT: 32 U/L (ref 9–46)
AST: 37 U/L (ref 10–40)
Albumin: 4.2 g/dL (ref 3.6–5.1)
Alkaline phosphatase (APISO): 45 U/L (ref 36–130)
Bilirubin, Direct: 0.2 mg/dL (ref 0.0–0.2)
Globulin: 2.6 g/dL (calc) (ref 1.9–3.7)
Indirect Bilirubin: 0.6 mg/dL (calc) (ref 0.2–1.2)
Total Bilirubin: 0.8 mg/dL (ref 0.2–1.2)
Total Protein: 6.8 g/dL (ref 6.1–8.1)

## 2020-07-08 LAB — CREATININE, SERUM: Creat: 1.25 mg/dL (ref 0.60–1.35)

## 2020-07-08 LAB — HIV-1 RNA QUANT-NO REFLEX-BLD
HIV 1 RNA Quant: <20 Copies/mL
HIV-1 RNA Quant, Log: <1.3 Log cps/mL

## 2020-07-08 LAB — RPR: RPR Ser Ql: NONREACTIVE

## 2020-07-08 LAB — HIV ANTIBODY (ROUTINE TESTING W REFLEX): HIV 1&2 Ab, 4th Generation: NONREACTIVE

## 2020-08-19 ENCOUNTER — Other Ambulatory Visit: Payer: Self-pay

## 2020-08-19 ENCOUNTER — Encounter (INDEPENDENT_AMBULATORY_CARE_PROVIDER_SITE_OTHER): Payer: Self-pay | Admitting: *Deleted

## 2020-08-19 VITALS — BP 121/75 | HR 74 | Temp 98.4°F | Wt 234.0 lb

## 2020-08-19 DIAGNOSIS — Z006 Encounter for examination for normal comparison and control in clinical research program: Secondary | ICD-10-CM

## 2020-08-19 NOTE — Research (Signed)
Michael Duncan seen today for his Step 4C Week 32 HPTN 083 visit. Denied any injection site reactions after his last injection. No new complaints or medications. Rapid HIV non-reactive today. He received cabotegravir injection to (R) glute without problem. He will return in April for his next study visit.

## 2020-08-21 LAB — HIV-1 RNA QUANT-NO REFLEX-BLD
HIV 1 RNA Quant: <20 Copies/mL
HIV-1 RNA Quant, Log: <1.3 Log cps/mL

## 2020-08-21 LAB — HIV ANTIBODY (ROUTINE TESTING W REFLEX): HIV 1&2 Ab, 4th Generation: NONREACTIVE

## 2020-09-02 ENCOUNTER — Ambulatory Visit (INDEPENDENT_AMBULATORY_CARE_PROVIDER_SITE_OTHER): Payer: BC Managed Care – PPO | Admitting: Neurology

## 2020-09-02 ENCOUNTER — Encounter: Payer: Self-pay | Admitting: Neurology

## 2020-09-02 VITALS — BP 129/75 | HR 61 | Ht 73.0 in | Wt 233.0 lb

## 2020-09-02 DIAGNOSIS — G510 Bell's palsy: Secondary | ICD-10-CM

## 2020-09-02 NOTE — Progress Notes (Signed)
Subjective:    Patient ID: Michael Duncan is a 39 y.o. male.  HPI     Michael Foley, MD, PhD [facility] [address], [address] [address] [place], [place] [address]  I saw patient, Michael Duncan, as a referral from the emergency department for Bell's palsy.  The patient is unaccompanied today.  He is a 40 year old right-handed gentleman with an underlying benign medical history, who reports right facial weakness which started on 06/27/2020.  He went to the emergency room at [facility] on 06/28/2020.  I reviewed the emergency room records.  He was felt to have incomplete right-sided facial palsy and he was treated symptomatically with eye ointments, Valtrex 1000 g 3 times daily for a week and also prednisone 60 mg daily for 1 week.  He was advised to consult with ophthalmology and a referral was placed to Dr. Dione Booze.  He was advised to follow-up as an outpatient with neurology.  He had a head CT without contrast on 06/28/2020 and I reviewed the results:  IMPRESSION: Study within normal limits. He is currently participating in a drug trial through infectious diseases for an HIV premedication, patient is negative for HIV himself.     He reports that he had complete resolution of his right facial weakness about 4 weeks after the symptoms started.  He did not end up having a referral to ophthalmology and is not aware of this.  He did not see an eye doctor.  He used an over-the-counter eye patch for the first couple of nights but stopped using it as he was able to close his eyes.  He never had any eye irritation.  He did use some lubricating eyedrops but no longer uses them.  He completed his prednisone course as well as Valtrex.  He feels back to baseline, he denies any prodromal illness.  Never had any sudden onset of one-sided weakness otherwise.  He did not have any ear symptoms.  His Past Medical History Is Significant For: No past medical history on file.  His Past  Surgical History Is Significant For: No past surgical history on file.  His Family History Is Significant For: No family history on file.  His Social History Is Significant For: Social History   Socioeconomic History  . Marital status: Married    Spouse name: Not on file  . Number of children: Not on file  . Years of education: Not on file  . Highest education level: Not on file  Occupational History  . Not on file  Tobacco Use  . Smoking status: Never Smoker  . Smokeless tobacco: Never Used  Vaping Use  . Vaping Use: Never used  Substance and Sexual Activity  . Alcohol use: No  . Drug use: No  . Sexual activity: Not on file  Other Topics Concern  . Not on file  Social History Narrative  . Not on file   Social Determinants of Health   Financial Resource Strain: Not on file  Food Insecurity: Not on file  Transportation Needs: Not on file  Physical Activity: Not on file  Stress: Not on file  Social Connections: Not on file    His Allergies Are:  No Known Allergies:   His Current Medications Are:  Outpatient Encounter Medications as of 09/02/2020  Medication Sig  . Study - HPTN 083 (Step 4, open-label) - cabotegravir 600 mg/3 mL intramuscular injection (PI-[place] Dam) Inject 3 mLs (600 mg total) into the muscle every 8 (eight)  weeks.  . [DISCONTINUED] amoxicillin-clavulanate (AUGMENTIN) 875-125 MG tablet TK 1 T PO BID FOR 10 DAYS  . [DISCONTINUED] artificial tears (LACRILUBE) OINT ophthalmic ointment Place into both eyes every 4 (four) hours as needed for dry eyes.  . [DISCONTINUED] doxycycline (VIBRA-TABS) 100 MG tablet Take 1 tablet (100 mg total) by mouth 2 (two) times daily. (Patient not taking: Reported on 03/12/2019)  . [DISCONTINUED] ibuprofen (ADVIL,MOTRIN) 600 MG tablet Take 1 tablet (600 mg total) by mouth every 6 (six) hours as needed. (Patient not taking: Reported on 03/12/2019)  . [DISCONTINUED] meloxicam (MOBIC) 15 MG tablet Take 1 tablet daily with food for 7  days. Then take as needed.  . [DISCONTINUED] NON FORMULARY [facility]  Wart Cream-  Cimetidine 2%, Deoxy-D-Glucose 0.2%, Fluorouracil-5 5%, Salicylic Acid 15% Apply 1-2 grams to affected area 3-4 times daily Qty. 120 gm 3 refills  . [DISCONTINUED] predniSONE (DELTASONE) 20 MG tablet 3 tabs po daily x 7 days  . [DISCONTINUED] triamcinolone (NASACORT) 55 MCG/ACT AERO nasal inhaler Place into the nose.  . [DISCONTINUED] valACYclovir (VALTREX) 1000 MG tablet Take 1 tablet (1,000 mg total) by mouth 3 (three) times daily.   No facility-administered encounter medications on file as of 09/02/2020.  :   Review of Systems:  Out of a complete 14 point review of systems, all are reviewed and negative with the exception of these symptoms as listed below:  Review of Systems  Neurological:       Here for consult on Bells Palsy.  Pt reports back in Jan he had right facial droop and unable to wink his eye. Bells Palsy work up, Prednisone Valtrex. Pt reports sx have resolved and he is doing well.     Objective:  Neurological Exam  Physical Exam Physical Examination:   Vitals:   09/02/20 1444  BP: 129/75  Pulse: 61   General Examination: The patient is a very pleasant 40 y.o. male in no acute distress. He appears well-developed and well-nourished and well groomed.   HEENT: Normocephalic, atraumatic, pupils are equal, round and reactive to light and accommodation. Extraocular tracking is good without limitation to gaze excursion or nystagmus noted. Normal smooth pursuit is noted. Hearing is grossly intact. Tympanic membranes are clear bilaterally. Face is symmetric with normal facial animation and normal facial sensation.  No Bell's phenomenon, no residual facial weakness noted.  Speech is clear with no dysarthria noted. There is no hypophonia. There is no lip, neck/head, jaw or voice tremor. Neck is supple with full range of passive and active motion. There are no carotid bruits on  auscultation. Oropharynx exam reveals: mild mouth dryness, good dental hygiene. Tongue protrudes centrally and palate elevates symmetrically.   Chest: Clear to auscultation without wheezing, rhonchi or crackles noted.  Heart: S1+S2+0, regular and normal without murmurs, rubs or gallops noted.   Abdomen: Soft, non-tender and non-distended with normal bowel sounds appreciated on auscultation.  Extremities: There is no pitting edema in the distal lower extremities bilaterally.   Skin: Warm and dry without trophic changes noted.  Musculoskeletal: exam reveals no obvious joint deformities, tenderness or joint swelling or erythema.   Neurologically:  Mental status: The patient is awake, alert and oriented in all 4 spheres. His immediate and remote memory, attention, language skills and fund of knowledge are appropriate. There is no evidence of aphasia, agnosia, apraxia or anomia. Speech is clear with normal prosody and enunciation. Thought process is linear. Mood is normal and affect is normal.  Cranial nerves II - XII  are as described above under HEENT exam. In addition: shoulder shrug is normal with equal shoulder height noted. Motor exam: Normal bulk, strength and tone is noted. There is no drift, tremor or rebound. Romberg is negative. Reflexes are 2+ throughout. Babinski: Toes are flexor bilaterally. Fine motor skills and coordination: intact with normal finger taps, normal hand movements, normal rapid alternating patting, normal foot taps and normal foot agility.  Cerebellar testing: No dysmetria or intention tremor on finger to nose testing. Heel to shin is unremarkable bilaterally. There is no truncal or gait ataxia.  Sensory exam: intact to light touch, vibration, and temperature sense.  Gait, station and balance: He stands easily. No veering to one side is noted. No leaning to one side is noted. Posture is age-appropriate and stance is narrow based. Gait shows normal stride length and normal  pace. No problems turning are noted. Tandem walk is unremarkable.   Assessment and Plan:   In summary, Michael Duncan is a very pleasant 40 y.o.-year old male with an underlying benign medical history, who presents for evaluation of his right facial weakness.  He was diagnosed with Bell's palsy in early January 2022.  He had resolution of his symptoms about 4 weeks after symptoms started.  He has no residual symptoms and no evidence of residual Bell's palsy, thankfully, he feels at baseline as well.  Exam is nonfocal and benign.  He did not end up seeing an ophthalmologist or eye doctor.  He did not have any issues with eye irritation.  He completed the recommended course of prednisone as well as Valtrex.  He is reassured today.  I do not believe we need to add any additional testing from my end of things.  He is advised to follow-up with his primary care as scheduled. I answered all his questions today and the patient was in agreement.  Michael Foley, MD, PhD

## 2020-09-02 NOTE — Patient Instructions (Signed)
You likely had Bell's Palsy: weakness of your facial muscle due to irritation of your facial nerve on the left. Bell's palsy gets better with time.   Thankfully, you had complete resolution of your symptoms.    Your exam looks good.  I do not believe you need any additional testing from my end of things. Please follow-up with your primary care physician as scheduled. We can see her back in this clinic on an as-needed basis.

## 2020-10-07 ENCOUNTER — Encounter (INDEPENDENT_AMBULATORY_CARE_PROVIDER_SITE_OTHER): Payer: Self-pay | Admitting: *Deleted

## 2020-10-07 ENCOUNTER — Other Ambulatory Visit: Payer: Self-pay

## 2020-10-07 VITALS — BP 125/76 | HR 71 | Temp 98.1°F | Wt 237.5 lb

## 2020-10-07 DIAGNOSIS — Z006 Encounter for examination for normal comparison and control in clinical research program: Secondary | ICD-10-CM

## 2020-10-07 NOTE — Research (Signed)
Michael Duncan here today for his Step 4C week 40 HPTN 083 study visit. Denied any injection site reaction after his last visit. No new complaints or medications. Rapid HIV was non-reactive today. Cabotegravir injection given (R) upper outer glute area without problem. He will return in June for his next study visit.

## 2020-10-10 LAB — HIV-1 RNA QUANT-NO REFLEX-BLD
HIV 1 RNA Quant: NOT DETECTED Copies/mL
HIV-1 RNA Quant, Log: NOT DETECTED Log cps/mL

## 2020-10-10 LAB — HIV ANTIBODY (ROUTINE TESTING W REFLEX): HIV 1&2 Ab, 4th Generation: NONREACTIVE

## 2020-12-09 ENCOUNTER — Other Ambulatory Visit: Payer: Self-pay

## 2020-12-09 ENCOUNTER — Other Ambulatory Visit (HOSPITAL_COMMUNITY): Payer: Self-pay

## 2020-12-09 ENCOUNTER — Encounter (INDEPENDENT_AMBULATORY_CARE_PROVIDER_SITE_OTHER): Payer: Self-pay | Admitting: *Deleted

## 2020-12-09 VITALS — BP 113/73 | HR 58 | Temp 98.2°F | Wt 234.2 lb

## 2020-12-09 DIAGNOSIS — Z006 Encounter for examination for normal comparison and control in clinical research program: Secondary | ICD-10-CM

## 2020-12-09 NOTE — Research (Signed)
Michael Duncan here today for his Step 4C Week 48 HPTN 083 study visit. Denied any injection site reactions after his last study injection. No new complaints or medications. Rapid HIV was non-reactive today. Cabotegravir injection given (L) glute without problem. He is scheduled to return in August for his next study visit.

## 2020-12-10 DIAGNOSIS — S99912A Unspecified injury of left ankle, initial encounter: Secondary | ICD-10-CM | POA: Diagnosis not present

## 2020-12-10 DIAGNOSIS — S99922A Unspecified injury of left foot, initial encounter: Secondary | ICD-10-CM | POA: Diagnosis not present

## 2020-12-10 LAB — C. TRACHOMATIS/N. GONORRHOEAE RNA
C. trachomatis RNA, TMA: NOT DETECTED
N. gonorrhoeae RNA, TMA: NOT DETECTED

## 2020-12-10 LAB — CT/NG RNA, TMA RECTAL
Chlamydia Trachomatis RNA: NOT DETECTED
Neisseria Gonorrhoeae RNA: NOT DETECTED

## 2020-12-12 LAB — HEPATIC FUNCTION PANEL
AG Ratio: 1.5 (calc) (ref 1.0–2.5)
ALT: 22 U/L (ref 9–46)
AST: 25 U/L (ref 10–40)
Albumin: 3.9 g/dL (ref 3.6–5.1)
Alkaline phosphatase (APISO): 41 U/L (ref 36–130)
Bilirubin, Direct: 0.2 mg/dL (ref 0.0–0.2)
Globulin: 2.6 g/dL (calc) (ref 1.9–3.7)
Indirect Bilirubin: 0.8 mg/dL (calc) (ref 0.2–1.2)
Total Bilirubin: 1 mg/dL (ref 0.2–1.2)
Total Protein: 6.5 g/dL (ref 6.1–8.1)

## 2020-12-12 LAB — HIV ANTIBODY (ROUTINE TESTING W REFLEX): HIV 1&2 Ab, 4th Generation: NONREACTIVE

## 2020-12-12 LAB — HEPATITIS C ANTIBODY
Hepatitis C Ab: NONREACTIVE
SIGNAL TO CUT-OFF: 0.01 (ref <1.00)

## 2020-12-12 LAB — RPR: RPR Ser Ql: NONREACTIVE

## 2020-12-12 LAB — CREATININE, SERUM: Creat: 1.1 mg/dL (ref 0.60–1.35)

## 2020-12-12 LAB — HIV-1 RNA QUANT-NO REFLEX-BLD
HIV 1 RNA Quant: NOT DETECTED Copies/mL
HIV-1 RNA Quant, Log: NOT DETECTED Log cps/mL

## 2020-12-15 ENCOUNTER — Other Ambulatory Visit (HOSPITAL_COMMUNITY): Payer: Self-pay

## 2020-12-15 DIAGNOSIS — S99912A Unspecified injury of left ankle, initial encounter: Secondary | ICD-10-CM | POA: Diagnosis not present

## 2021-01-10 ENCOUNTER — Other Ambulatory Visit (HOSPITAL_COMMUNITY): Payer: Self-pay

## 2021-01-14 DIAGNOSIS — M9904 Segmental and somatic dysfunction of sacral region: Secondary | ICD-10-CM | POA: Diagnosis not present

## 2021-01-14 DIAGNOSIS — M9903 Segmental and somatic dysfunction of lumbar region: Secondary | ICD-10-CM | POA: Diagnosis not present

## 2021-01-14 DIAGNOSIS — M9901 Segmental and somatic dysfunction of cervical region: Secondary | ICD-10-CM | POA: Diagnosis not present

## 2021-01-14 DIAGNOSIS — M9902 Segmental and somatic dysfunction of thoracic region: Secondary | ICD-10-CM | POA: Diagnosis not present

## 2021-01-24 ENCOUNTER — Other Ambulatory Visit (HOSPITAL_COMMUNITY): Payer: Self-pay

## 2021-01-27 ENCOUNTER — Telehealth: Payer: Self-pay

## 2021-01-27 NOTE — Telephone Encounter (Signed)
[facility] Patient Advocate Encounter  Apretude is covered under the patient medical benefits.  Patient will have a no copay office visit. The patient have met his $500 deductible but have not met his out of pocket deductible of $2000.  Patient is enrolled in [employer] Portal Claims  Donna Dean, CPhT [facility] Patient Advocate [facility] Phone: [phone] Fax:  [phone]  

## 2021-02-03 ENCOUNTER — Other Ambulatory Visit: Payer: Self-pay | Admitting: Pharmacist

## 2021-02-03 DIAGNOSIS — Z79899 Other long term (current) drug therapy: Secondary | ICD-10-CM

## 2021-02-03 DIAGNOSIS — Z113 Encounter for screening for infections with a predominantly sexual mode of transmission: Secondary | ICD-10-CM

## 2021-02-10 ENCOUNTER — Ambulatory Visit: Payer: BC Managed Care – PPO | Admitting: Pharmacist

## 2021-02-10 ENCOUNTER — Other Ambulatory Visit: Payer: BC Managed Care – PPO

## 2021-02-16 ENCOUNTER — Other Ambulatory Visit: Payer: BC Managed Care – PPO

## 2021-02-16 ENCOUNTER — Other Ambulatory Visit (HOSPITAL_COMMUNITY)
Admission: RE | Admit: 2021-02-16 | Discharge: 2021-02-16 | Disposition: A | Discharge status: Home / Self Care | Payer: BC Managed Care – PPO | Source: Ambulatory Visit | Attending: Infectious Disease | Admitting: Infectious Disease

## 2021-02-16 ENCOUNTER — Ambulatory Visit (INDEPENDENT_AMBULATORY_CARE_PROVIDER_SITE_OTHER): Payer: BC Managed Care – PPO | Admitting: Pharmacist

## 2021-02-16 ENCOUNTER — Other Ambulatory Visit: Payer: Self-pay

## 2021-02-16 DIAGNOSIS — Z113 Encounter for screening for infections with a predominantly sexual mode of transmission: Secondary | ICD-10-CM

## 2021-02-16 DIAGNOSIS — Z79899 Other long term (current) drug therapy: Secondary | ICD-10-CM

## 2021-02-16 MED ORDER — CABOTEGRAVIR ER 600 MG/3ML IM SUER
600.0000 mg | Freq: Once | INTRAMUSCULAR | Status: AC
  Administered 2021-02-16: 600 mg via INTRAMUSCULAR

## 2021-02-16 NOTE — Progress Notes (Addendum)
HPI: Michael Duncan is a 41 y.o. male who presents to the [facility] pharmacy clinic for Apretude administration and HIV PrEP follow up.  Patient Active Problem List   Diagnosis Date Noted   Chronic pansinusitis 12/03/2017   Deviated septum 12/03/2017   Hypertrophy, nasal, turbinate 12/03/2017    Patient's Medications  New Prescriptions   No medications on file  Previous Medications   STUDY - HPTN 083 (STEP 4, OPEN-LABEL) - CABOTEGRAVIR 600 MG/3 ML INTRAMUSCULAR INJECTION (PI-VAN DAM)    Inject 3 mLs (600 mg total) into the muscle every 8 (eight) weeks.  Modified Medications   No medications on file  Discontinued Medications   No medications on file    Allergies: No Known Allergies  Past Medical History: No past medical history on file.  Social History: Social History   Socioeconomic History   Marital status: Married    Spouse name: Not on file   Number of children: Not on file   Years of education: Not on file   Highest education level: Not on file  Occupational History   Not on file  Tobacco Use   Smoking status: Never   Smokeless tobacco: Never  Vaping Use   Vaping Use: Never used  Substance and Sexual Activity   Alcohol use: No   Drug use: No   Sexual activity: Not on file  Other Topics Concern   Not on file  Social History Narrative   Not on file   Social Determinants of Health   Financial Resource Strain: Not on file  Food Insecurity: Not on file  Transportation Needs: Not on file  Physical Activity: Not on file  Stress: Not on file  Social Connections: Not on file    Labs: Lab Results  Component Value Date   HIV1RNAQUANT Not Detected 12/09/2020   HIV1RNAQUANT Not Detected 10/07/2020   HIV1RNAQUANT <20 08/19/2020    RPR and STI Lab Results  Component Value Date   LABRPR NON-REACTIVE 12/09/2020   LABRPR NON-REACTIVE 07/01/2020   LABRPR NON-REACTIVE 09/16/2019   LABRPR NON-REACTIVE 04/03/2019   LABRPR NON-REACTIVE 10/16/2018    STI  Results GC CT  10/12/2016 NOT DETECTED NOT DETECTED    Hepatitis B Lab Results  Component Value Date   HEPBSAB POS (A) 10/12/2016   HEPBSAG NEGATIVE 10/04/2016   HEPBCAB NON REACTIVE 10/12/2016   Hepatitis C Lab Results  Component Value Date   HEPCAB NON-REACTIVE 12/09/2020   Hepatitis A No results found for: HAV Lipids: Lab Results  Component Value Date   CHOL 140 10/16/2018   TRIG 76 10/16/2018   HDL 43 10/16/2018   CHOLHDL 3.3 10/16/2018   VLDL 13 10/12/2016   LDLCALC 81 10/16/2018    TARGET DATE: The 24th of the month  Assessment: Michael Duncan presents today for their Apretude injection and to follow up for HIV PrEP. Past injections were tolerated well without issues. No problems with systemic side effects of injection.  No signs or symptoms of anything today. Screened patient for acute HIV symptoms such as fatigue, muscle aches, rash, sore throat, lymphadenopathy, headache, night sweats, nausea/vomiting/diarrhea, and fever. Patient denies any symptoms. No new partners since last injection. Counseled that condoms are still advised and encouraged. Rapid HIV antibody blood test was drawn immediately prior to injection and was negative. Patient also had HIV RNA drawn today.   Administered cabotegravir 600mg /65mL in left upper outer quadrant of the gluteal muscle. Monitored patient for 10 minutes after injection. Injection was tolerated well without issue.  Last  STI screening was 6/16 and was negative. No need for further kidney monitoring with Apretude. Will see him back in 2 months for injection, labs, and HIV PrEP follow up.  Plan: - Apretude injection administered - HIV RNA, RPR, and urine cytology today - Next injection, labs, and PrEP follow up appointment scheduled for 10/25 - Call with any issues or questions  Younis Mathey L. Xion Debruyne, PharmD, BCIDP, AAHIVP, CPP Clinical Pharmacist Practitioner Infectious Diseases Clinical Pharmacist [facility]

## 2021-02-17 LAB — URINE CYTOLOGY ANCILLARY ONLY
Chlamydia: NEGATIVE
Comment: NEGATIVE
Comment: NORMAL
Neisseria Gonorrhea: NEGATIVE

## 2021-02-20 LAB — HIV-1 RNA QUANT-NO REFLEX-BLD
HIV 1 RNA Quant: NOT DETECTED Copies/mL
HIV-1 RNA Quant, Log: NOT DETECTED Log cps/mL

## 2021-02-20 LAB — RPR: RPR Ser Ql: NONREACTIVE

## 2021-04-19 ENCOUNTER — Other Ambulatory Visit: Payer: Self-pay

## 2021-04-19 ENCOUNTER — Other Ambulatory Visit: Payer: BC Managed Care – PPO

## 2021-04-19 ENCOUNTER — Ambulatory Visit (INDEPENDENT_AMBULATORY_CARE_PROVIDER_SITE_OTHER): Payer: BC Managed Care – PPO | Admitting: Pharmacist

## 2021-04-19 ENCOUNTER — Other Ambulatory Visit: Payer: Self-pay | Admitting: Pharmacist

## 2021-04-19 DIAGNOSIS — Z79899 Other long term (current) drug therapy: Secondary | ICD-10-CM

## 2021-04-19 MED ORDER — CABOTEGRAVIR ER 600 MG/3ML IM SUER
600.0000 mg | Freq: Once | INTRAMUSCULAR | Status: AC
  Administered 2021-04-19: 600 mg via INTRAMUSCULAR

## 2021-04-19 MED ORDER — APRETUDE 600 MG/3ML IM SUER: 600.0000 mg | INTRAMUSCULAR | 5 refills | Status: DC

## 2021-04-19 NOTE — Progress Notes (Signed)
HPI: Michael Duncan is a 40 y.o. male who presents to the [facility] pharmacy clinic for Apretude administration and HIV PrEP follow up.  Patient Active Problem List   Diagnosis Date Noted   Chronic pansinusitis 12/03/2017   Deviated septum 12/03/2017   Hypertrophy, nasal, turbinate 12/03/2017    Patient's Medications  New Prescriptions   No medications on file  Previous Medications   STUDY - HPTN 083 (STEP 4, OPEN-LABEL) - CABOTEGRAVIR 600 MG/3 ML INTRAMUSCULAR INJECTION (PI-VAN DAM)    Inject 3 mLs (600 mg total) into the muscle every 8 (eight) weeks.  Modified Medications   No medications on file  Discontinued Medications   No medications on file    Allergies: No Known Allergies  Past Medical History: No past medical history on file.  Social History: Social History   Socioeconomic History   Marital status: Married    Spouse name: Not on file   Number of children: Not on file   Years of education: Not on file   Highest education level: Not on file  Occupational History   Not on file  Tobacco Use   Smoking status: Never   Smokeless tobacco: Never  Vaping Use   Vaping Use: Never used  Substance and Sexual Activity   Alcohol use: No   Drug use: No   Sexual activity: Not on file  Other Topics Concern   Not on file  Social History Narrative   Not on file   Social Determinants of Health   Financial Resource Strain: Not on file  Food Insecurity: Not on file  Transportation Needs: Not on file  Physical Activity: Not on file  Stress: Not on file  Social Connections: Not on file    Labs: Lab Results  Component Value Date   HIV1RNAQUANT Not Detected 02/16/2021   HIV1RNAQUANT Not Detected 12/09/2020   HIV1RNAQUANT Not Detected 10/07/2020    RPR and STI Lab Results  Component Value Date   LABRPR NON-REACTIVE 02/16/2021   LABRPR NON-REACTIVE 12/09/2020   LABRPR NON-REACTIVE 07/01/2020   LABRPR NON-REACTIVE 09/16/2019   LABRPR NON-REACTIVE 04/03/2019     STI Results GC GC CT CT  02/16/2021 Negative - Negative -  10/12/2016 - NOT DETECTED - NOT DETECTED    Hepatitis B Lab Results  Component Value Date   HEPBSAB POS (A) 10/12/2016   HEPBSAG NEGATIVE 10/04/2016   HEPBCAB NON REACTIVE 10/12/2016   Hepatitis C Lab Results  Component Value Date   HEPCAB NON-REACTIVE 12/09/2020   Hepatitis A No results found for: HAV Lipids: Lab Results  Component Value Date   CHOL 140 10/16/2018   TRIG 76 10/16/2018   HDL 43 10/16/2018   CHOLHDL 3.3 10/16/2018   VLDL 13 10/12/2016   LDLCALC 81 10/16/2018    TARGET DATE: The 24th of the month  Assessment: Michael Duncan presents today for their Apretude injection and to follow up for HIV PrEP. Initial/past injection was tolerated well without issues. No problems with systemic side effects of injection.  No signs or symptoms of anything today. Screened patient for acute HIV symptoms such as fatigue, muscle aches, rash, sore throat, lymphadenopathy, headache, night sweats, nausea/vomiting/diarrhea, and fever. Patient denies any symptoms. No new partners since last injection.  Rapid HIV antibody blood test was drawn immediately prior to injection and was negative. Patient also had HIV RNA drawn today.   Administered cabotegravir 600mg /55mL in left upper outer quadrant of the gluteal muscle. Monitored patient for 10 minutes after injection. Injection was tolerated well  without issue.  Last STI screening was 01/27/21 and was negative. No need for further kidney monitoring with Apretude. Will see him back in 2 months for injection, labs, and HIV PrEP follow up. Will get his flu shot at his annual PCP follow up on Monday, 10/31.  Plan: - Apretude injection administered - Next injection, labs, and PrEP follow up appointment scheduled for 06/13/21 - Call with any issues or questions  Michael Duncan, PharmD, BCIDP, AAHIVP, CPP Clinical Pharmacist Practitioner Infectious Diseases Clinical  Pharmacist [facility]

## 2021-04-22 LAB — HIV-1 RNA QUANT-NO REFLEX-BLD
HIV 1 RNA Quant: NOT DETECTED Copies/mL
HIV-1 RNA Quant, Log: NOT DETECTED Log cps/mL

## 2021-04-25 DIAGNOSIS — Z13220 Encounter for screening for lipoid disorders: Secondary | ICD-10-CM | POA: Diagnosis not present

## 2021-04-25 DIAGNOSIS — Z23 Encounter for immunization: Secondary | ICD-10-CM | POA: Diagnosis not present

## 2021-04-25 DIAGNOSIS — Z0000 Encounter for general adult medical examination without abnormal findings: Secondary | ICD-10-CM | POA: Diagnosis not present

## 2021-05-26 ENCOUNTER — Other Ambulatory Visit: Payer: Self-pay | Admitting: Pharmacist

## 2021-05-26 DIAGNOSIS — Z79899 Other long term (current) drug therapy: Secondary | ICD-10-CM

## 2021-05-26 DIAGNOSIS — Z113 Encounter for screening for infections with a predominantly sexual mode of transmission: Secondary | ICD-10-CM

## 2021-05-27 ENCOUNTER — Ambulatory Visit (INDEPENDENT_AMBULATORY_CARE_PROVIDER_SITE_OTHER): Payer: BC Managed Care – PPO

## 2021-05-27 ENCOUNTER — Other Ambulatory Visit: Payer: Self-pay

## 2021-05-27 ENCOUNTER — Ambulatory Visit (INDEPENDENT_AMBULATORY_CARE_PROVIDER_SITE_OTHER): Payer: BC Managed Care – PPO | Admitting: Podiatry

## 2021-05-27 ENCOUNTER — Encounter: Payer: Self-pay | Admitting: Podiatry

## 2021-05-27 DIAGNOSIS — F5221 Male erectile disorder: Secondary | ICD-10-CM | POA: Insufficient documentation

## 2021-05-27 DIAGNOSIS — G44209 Tension-type headache, unspecified, not intractable: Secondary | ICD-10-CM | POA: Insufficient documentation

## 2021-05-27 DIAGNOSIS — N469 Male infertility, unspecified: Secondary | ICD-10-CM | POA: Insufficient documentation

## 2021-05-27 DIAGNOSIS — M778 Other enthesopathies, not elsewhere classified: Secondary | ICD-10-CM | POA: Diagnosis not present

## 2021-05-27 MED ORDER — TRIAMCINOLONE ACETONIDE 10 MG/ML IJ SUSP
10.0000 mg | Freq: Once | INTRAMUSCULAR | Status: AC
  Administered 2021-05-27: 10 mg

## 2021-05-27 MED ORDER — DICLOFENAC SODIUM 75 MG PO TBEC: 75.0000 mg | DELAYED_RELEASE_TABLET | Freq: Two times a day (BID) | ORAL | 2 refills | Status: DC

## 2021-05-27 NOTE — Progress Notes (Signed)
Subjective:   Patient ID: Michael Duncan, male   DOB: 40 y.o.   MRN: [id number]   HPI Patient presents stating he has been noticing discomfort in the forefoot bilateral with inflammation present and states that he feels like it is wet or it giving him trouble.  The left seems to bother him more than the right and patient's not been seen for a number of years.  Patient does not smoke and does like to be active and run and do different types of sporting activities   Review of Systems  All other systems reviewed and are negative.      Objective:  Physical Exam Vitals and nursing note reviewed.  Constitutional:      Appearance: He is well-developed.  Pulmonary:     Effort: Pulmonary effort is normal.  Musculoskeletal:        General: Normal range of motion.  Skin:    General: Skin is warm.  Neurological:     Mental Status: He is alert.    Neurovascular status found to be intact muscle strength found to be adequate range of motion adequate.  Patient does have inflammation fluid around the second metatarsal phalangeal joint left over right and does have small blister formations which may be due to excessive sweating of both feet.  Patient is found to have good digital perfusion well oriented x3     Assessment:  Difficult to make complete determination between skin manifestations and inflammatory condition.  There is lifting of the second toe left foot with digital deformity and pressure against the second metatarsal phalangeal joint left     Plan:  Inflammatory capsulitis left over right foot with the second MPJ the most affected with possibility for dermatitis or excessive sweating.  X-rays reviewed and today I did anesthesia of the forefoot left 60 mg like Marcaine mixture aspirated the second MPJ got out a small amount of fluid which actually did have some blood indicating there may have been a slight tear of the plantar surface and I did inject with quarter cc dexamethasone Kenalog to  reduce the inflammation and fluid of the joint.  I advised on rigid bottom shoes I want to see back in 2 weeks and I did discuss long-term orthotic therapy for this patient will I think will be of great benefit for him.  Patient to be seen back also placed on diclofenac 75 mg twice daily  X-rays were negative for signs of fracture around the MPJs negative for parabola issues with mild hammertoe deformity second left

## 2021-06-10 ENCOUNTER — Other Ambulatory Visit: Payer: Self-pay

## 2021-06-10 ENCOUNTER — Ambulatory Visit (INDEPENDENT_AMBULATORY_CARE_PROVIDER_SITE_OTHER): Payer: BC Managed Care – PPO | Admitting: Podiatry

## 2021-06-10 ENCOUNTER — Encounter: Payer: Self-pay | Admitting: Podiatry

## 2021-06-10 DIAGNOSIS — M778 Other enthesopathies, not elsewhere classified: Secondary | ICD-10-CM

## 2021-06-11 NOTE — Progress Notes (Signed)
Subjective:   Patient ID: Michael Duncan, male   DOB: 40 y.o.   MRN: [id number]   HPI Patient presents stating that he is improved but he still has quite a bit of pain and he knows he also has digital deformities with this condition   ROS      Objective:  Physical Exam  Neurovascular status intact with patient found to have continued discomfort in the second metatarsal phalangeal joint left over right with improvement with injection but still discomfort with palpation.  Moderate depression of the arch is noted     Assessment:  Inflammatory capsulitis of the second MPJ left over right with pain still present     Plan:  H&P reviewed condition and at this point I have recommended long-term orthotics to both give him's reduced stress against his arch and lift his arches and also take pressure off the MPJ.  He is casted for functional orthotics today and will be seen back when returned

## 2021-06-13 ENCOUNTER — Other Ambulatory Visit: Payer: BC Managed Care – PPO

## 2021-06-13 ENCOUNTER — Other Ambulatory Visit (HOSPITAL_COMMUNITY)
Admission: RE | Admit: 2021-06-13 | Discharge: 2021-06-13 | Disposition: A | Discharge status: Home / Self Care | Payer: BC Managed Care – PPO | Source: Ambulatory Visit | Attending: Infectious Disease | Admitting: Infectious Disease

## 2021-06-13 ENCOUNTER — Other Ambulatory Visit: Payer: Self-pay

## 2021-06-13 ENCOUNTER — Ambulatory Visit (INDEPENDENT_AMBULATORY_CARE_PROVIDER_SITE_OTHER): Payer: BC Managed Care – PPO | Admitting: Pharmacist

## 2021-06-13 DIAGNOSIS — Z79899 Other long term (current) drug therapy: Secondary | ICD-10-CM

## 2021-06-13 DIAGNOSIS — Z113 Encounter for screening for infections with a predominantly sexual mode of transmission: Secondary | ICD-10-CM

## 2021-06-13 MED ORDER — CABOTEGRAVIR ER 600 MG/3ML IM SUER
600.0000 mg | Freq: Once | INTRAMUSCULAR | Status: AC
Start: 2021-06-13 — End: 2021-06-13
  Administered 2021-06-13: 12:00:00 600 mg via INTRAMUSCULAR

## 2021-06-13 NOTE — Progress Notes (Signed)
HPI: Michael Duncan is a 40 y.o. male who presents to the [facility] pharmacy clinic for Apretude administration and HIV PrEP follow up.  Patient Active Problem List   Diagnosis Date Noted   Male erectile disorder (CODE) 05/27/2021   Male infertility 05/27/2021   Tension type headache 05/27/2021   Chronic pansinusitis 12/03/2017   Deviated septum 12/03/2017   Hypertrophy, nasal, turbinate 12/03/2017    Patient's Medications  New Prescriptions   No medications on file  Previous Medications   CABOTEGRAVIR ER (APRETUDE) 600 MG/3ML INJECTION    Inject 3 mLs (600 mg total) into the muscle every 2 (two) months.   DICLOFENAC (VOLTAREN) 75 MG EC TABLET    Take 1 tablet (75 mg total) by mouth 2 (two) times daily.   SILDENAFIL (REVATIO) 20 MG TABLET    See admin instructions.  Modified Medications   No medications on file  Discontinued Medications   No medications on file    Allergies: No Known Allergies  Past Medical History: No past medical history on file.  Social History: Social History   Socioeconomic History   Marital status: Married    Spouse name: Not on file   Number of children: Not on file   Years of education: Not on file   Highest education level: Not on file  Occupational History   Not on file  Tobacco Use   Smoking status: Never   Smokeless tobacco: Never  Vaping Use   Vaping Use: Never used  Substance and Sexual Activity   Alcohol use: No   Drug use: No   Sexual activity: Not on file  Other Topics Concern   Not on file  Social History Narrative   Not on file   Social Determinants of Health   Financial Resource Strain: Not on file  Food Insecurity: Not on file  Transportation Needs: Not on file  Physical Activity: Not on file  Stress: Not on file  Social Connections: Not on file    Labs: Lab Results  Component Value Date   HIV1RNAQUANT Not Detected 04/19/2021   HIV1RNAQUANT Not Detected 02/16/2021   HIV1RNAQUANT Not Detected 12/09/2020    RPR  and STI Lab Results  Component Value Date   LABRPR NON-REACTIVE 02/16/2021   LABRPR NON-REACTIVE 12/09/2020   LABRPR NON-REACTIVE 07/01/2020   LABRPR NON-REACTIVE 09/16/2019   LABRPR NON-REACTIVE 04/03/2019    STI Results GC GC CT CT  02/16/2021 Negative - Negative -  10/12/2016 - NOT DETECTED - NOT DETECTED    Hepatitis B Lab Results  Component Value Date   HEPBSAB POS (A) 10/12/2016   HEPBSAG NEGATIVE 10/04/2016   HEPBCAB NON REACTIVE 10/12/2016   Hepatitis C Lab Results  Component Value Date   HEPCAB NON-REACTIVE 12/09/2020   Hepatitis A No results found for: HAV Lipids: Lab Results  Component Value Date   CHOL 140 10/16/2018   TRIG 76 10/16/2018   HDL 43 10/16/2018   CHOLHDL 3.3 10/16/2018   VLDL 13 10/12/2016   LDLCALC 81 10/16/2018    TARGET DATE: 24th of the month  Assessment: Michael Duncan presents today for their Apretude injection and to follow up for HIV PrEP. Initial/past injection was tolerated well without issues. No problems with systemic side effects of injection. No signs or symptoms of anything today. Screened patient for acute HIV symptoms such as fatigue, muscle aches, rash, sore throat, lymphadenopathy, headache, night sweats, nausea/vomiting/diarrhea, and fever. Patient denies any symptoms. Has had new partners since last injection. Often does not use  condoms. Counseled that condoms are still advised and encouraged. Rapid HIV antibody blood test was drawn immediately prior to injection and was negative. Patient also had HIV RNA drawn today.   Administered cabotegravir 600mg /55mL in right upper outer quadrant of the gluteal muscle. Monitored patient for 10 minutes after injection. Injection was tolerated well without issue.  Last STI screening was August and was negative. No need for further kidney monitoring with Apretude. Will see him back in 2 months for injection, labs, and HIV PrEP follow up.  Plan: - Check HIV RNA, RPR, urine/oral/rectal  gonorrhea/chlamydia cytology - Apretude injection administered - Next injection, labs, and PrEP follow up appointment scheduled for 08/16/21 with Michael Duncan - Call with any issues or questions  08/18/21, PharmD PGY2 [facility] Pharmacy Resident 06/13/2021 10:58 AM

## 2021-06-14 LAB — CYTOLOGY, (ORAL, ANAL, URETHRAL) ANCILLARY ONLY
Chlamydia: NEGATIVE
Chlamydia: NEGATIVE
Comment: NEGATIVE
Comment: NEGATIVE
Comment: NORMAL
Comment: NORMAL
Neisseria Gonorrhea: NEGATIVE
Neisseria Gonorrhea: NEGATIVE

## 2021-06-14 LAB — URINE CYTOLOGY ANCILLARY ONLY
Chlamydia: NEGATIVE
Comment: NEGATIVE
Comment: NORMAL
Neisseria Gonorrhea: NEGATIVE

## 2021-06-15 LAB — HIV-1 RNA QUANT-NO REFLEX-BLD
HIV 1 RNA Quant: NOT DETECTED Copies/mL
HIV-1 RNA Quant, Log: NOT DETECTED Log cps/mL

## 2021-06-15 LAB — RPR: RPR Ser Ql: NONREACTIVE

## 2021-07-06 ENCOUNTER — Telehealth: Payer: Self-pay | Admitting: Podiatry

## 2021-07-06 NOTE — Telephone Encounter (Signed)
Orthotics in.. lvm for pt to call to schedule an appt to pick them up. °

## 2021-07-15 ENCOUNTER — Other Ambulatory Visit: Payer: Self-pay

## 2021-07-15 ENCOUNTER — Ambulatory Visit: Payer: BC Managed Care – PPO

## 2021-07-15 DIAGNOSIS — M778 Other enthesopathies, not elsewhere classified: Secondary | ICD-10-CM

## 2021-07-15 NOTE — Progress Notes (Signed)
SITUATION: Reason for Visit: Fitting and Delivery of Custom Fabricated Foot Orthoses Patient Report: Patient reports comfort and is satisfied with device.  OBJECTIVE DATA: Patient History / Diagnosis:     ICD-10-CM   1. Capsulitis of foot, unspecified laterality  M77.8       Provided Device:  Custom Functional Foot Orthotics  GOAL OF ORTHOSIS - Improve gait - Decrease energy expenditure - Improve Balance - Provide Triplanar stability of foot complex - Facilitate motion  ACTIONS PERFORMED Patient was fit with foot orthotics trimmed to shoe last. Patient tolerated fittign procedure.   Patient was provided with verbal and written instruction and demonstration regarding donning, doffing, wear, care, proper fit, function, purpose, cleaning, and use of the orthosis and in all related precautions and risks and benefits regarding the orthosis.  Patient was also provided with verbal instruction regarding how to report any failures or malfunctions of the orthosis and necessary follow up care. Patient was also instructed to contact our office regarding any change in status that may affect the function of the orthosis.  Patient demonstrated independence with proper donning, doffing, and fit and verbalized understanding of all instructions.  PLAN: Patient is to follow up in one week or as necessary (PRN). All questions were answered and concerns addressed. Plan of care was discussed with and agreed upon by the patient.

## 2021-08-12 ENCOUNTER — Other Ambulatory Visit: Payer: Self-pay | Admitting: Pharmacist

## 2021-08-12 DIAGNOSIS — Z79899 Other long term (current) drug therapy: Secondary | ICD-10-CM

## 2021-08-16 ENCOUNTER — Ambulatory Visit (INDEPENDENT_AMBULATORY_CARE_PROVIDER_SITE_OTHER): Payer: BC Managed Care – PPO | Admitting: Pharmacist

## 2021-08-16 ENCOUNTER — Other Ambulatory Visit: Payer: BC Managed Care – PPO

## 2021-08-16 ENCOUNTER — Other Ambulatory Visit: Payer: Self-pay

## 2021-08-16 DIAGNOSIS — Z79899 Other long term (current) drug therapy: Secondary | ICD-10-CM | POA: Diagnosis not present

## 2021-08-16 DIAGNOSIS — Z113 Encounter for screening for infections with a predominantly sexual mode of transmission: Secondary | ICD-10-CM | POA: Diagnosis not present

## 2021-08-16 MED ORDER — CABOTEGRAVIR ER 600 MG/3ML IM SUER
600.0000 mg | Freq: Once | INTRAMUSCULAR | Status: AC
  Administered 2021-08-16: 600 mg via INTRAMUSCULAR

## 2021-08-16 NOTE — Progress Notes (Signed)
HPI: Michael Duncan is a 41 y.o. male who presents to the [facility] clinic for Apretude administration and HIV PrEP follow up.  Patient Active Problem List   Diagnosis Date Noted   Male erectile disorder (CODE) 05/27/2021   Male infertility 05/27/2021   Tension type headache 05/27/2021   Chronic pansinusitis 12/03/2017   Deviated septum 12/03/2017   Hypertrophy, nasal, turbinate 12/03/2017    Patient's Medications  New Prescriptions   No medications on file  Previous Medications   CABOTEGRAVIR ER (APRETUDE) 600 MG/3ML INJECTION    Inject 3 mLs (600 mg total) into the muscle every 2 (two) months.   DICLOFENAC (VOLTAREN) 75 MG EC TABLET    Take 1 tablet (75 mg total) by mouth 2 (two) times daily.   SILDENAFIL (REVATIO) 20 MG TABLET    See admin instructions.  Modified Medications   No medications on file  Discontinued Medications   No medications on file    Allergies: No Known Allergies  Past Medical History: No past medical history on file.  Social History: Social History   Socioeconomic History   Marital status: Married    Spouse name: Not on file   Number of children: Not on file   Years of education: Not on file   Highest education level: Not on file  Occupational History   Not on file  Tobacco Use   Smoking status: Never   Smokeless tobacco: Never  Vaping Use   Vaping Use: Never used  Substance and Sexual Activity   Alcohol use: No   Drug use: No   Sexual activity: Not on file  Other Topics Concern   Not on file  Social History Narrative   Not on file   Social Determinants of Health   Financial Resource Strain: Not on file  Food Insecurity: Not on file  Transportation Needs: Not on file  Physical Activity: Not on file  Stress: Not on file  Social Connections: Not on file    Labs: Lab Results  Component Value Date   HIV1RNAQUANT Not Detected 06/13/2021   HIV1RNAQUANT Not Detected 04/19/2021   HIV1RNAQUANT Not Detected 02/16/2021    RPR  and STI Lab Results  Component Value Date   LABRPR NON-REACTIVE 06/13/2021   LABRPR NON-REACTIVE 02/16/2021   LABRPR NON-REACTIVE 12/09/2020   LABRPR NON-REACTIVE 07/01/2020   LABRPR NON-REACTIVE 09/16/2019    STI Results GC GC CT CT  06/13/2021 Negative - Negative -  06/13/2021 Negative - Negative -  06/13/2021 Negative - Negative -  02/16/2021 Negative - Negative -  10/12/2016 - NOT DETECTED - NOT DETECTED    Hepatitis B Lab Results  Component Value Date   HEPBSAB POS (A) 10/12/2016   HEPBSAG NEGATIVE 10/04/2016   HEPBCAB NON REACTIVE 10/12/2016   Hepatitis C Lab Results  Component Value Date   HEPCAB NON-REACTIVE 12/09/2020   Hepatitis A No results found for: HAV Lipids: Lab Results  Component Value Date   CHOL 140 10/16/2018   TRIG 76 10/16/2018   HDL 43 10/16/2018   CHOLHDL 3.3 10/16/2018   VLDL 13 10/12/2016   [place] 81 10/16/2018    TARGET DATE: The 24th of the month  Current PrEP Regimen: Apretude  Assessment: Michael Duncan presents today for their maintenance injection of Apretude and to follow up for HIV PrEP. No issues with injections. No new partners since last visit; remains sexually active with a few partners. Will check HIV RNA and urine/oral/rectal cytologies.  Administered cabotegravir 600mg /21mL in left upper outer  quadrant of the gluteal muscle. Will make follow up appointments for maintenance injections every 2 months.   Plan: - Maintenance injections scheduled for 4/25 with [name] - Check HIV RNA and urine/oral/rectal cytologies  - Call with any issues or questions  Alfonse Spruce, PharmD, CPP Clinical Pharmacist Practitioner [facility]

## 2021-08-17 ENCOUNTER — Other Ambulatory Visit: Payer: Self-pay | Admitting: Pharmacist

## 2021-08-17 DIAGNOSIS — A749 Chlamydial infection, unspecified: Secondary | ICD-10-CM

## 2021-08-17 MED ORDER — DOXYCYCLINE HYCLATE 100 MG PO TABS: 100.0000 mg | ORAL_TABLET | Freq: Two times a day (BID) | ORAL | 0 refills | Status: DC

## 2021-08-17 NOTE — Progress Notes (Signed)
Patient's urine cytology resulted positive for chlamydia. Sent in doxycycline 100mg  BID x 7 days to his preferred pharmacy. Informed patient to abstain from sexual activity x 7 days and to take doxycycline with food; he verbalized understanding.  , PharmD, CPP Clinical Pharmacist Practitioner Infectious Diseases Clinical Pharmacist [facility]

## 2021-08-18 LAB — C. TRACHOMATIS/N. GONORRHOEAE RNA
C. trachomatis RNA, TMA: DETECTED — AB
N. gonorrhoeae RNA, TMA: NOT DETECTED

## 2021-08-18 LAB — HIV-1 RNA QUANT-NO REFLEX-BLD
HIV 1 RNA Quant: NOT DETECTED Copies/mL
HIV-1 RNA Quant, Log: NOT DETECTED Log cps/mL

## 2021-08-19 LAB — CT/NG RNA, TMA RECTAL
Chlamydia Trachomatis RNA: NOT DETECTED
Neisseria Gonorrhoeae RNA: NOT DETECTED

## 2021-08-19 LAB — GC/CHLAMYDIA PROBE, AMP (THROAT)
Chlamydia trachomatis RNA: NOT DETECTED
Neisseria gonorrhoeae RNA: NOT DETECTED

## 2021-09-07 DIAGNOSIS — L300 Nummular dermatitis: Secondary | ICD-10-CM | POA: Diagnosis not present

## 2021-09-09 ENCOUNTER — Telehealth: Payer: Self-pay

## 2021-09-09 NOTE — Telephone Encounter (Signed)
Returned patient's voicemail regarding toe still being raised. Patient was unsure if it was an orthotic issue or not. After conversation with patient we determined it may be worth Dr. Paulla Dolly seeing for follow-up. Let him know I will be forwarding this to Dr. Paulla Dolly. ?

## 2021-09-12 DIAGNOSIS — A630 Anogenital (venereal) warts: Secondary | ICD-10-CM | POA: Diagnosis not present

## 2021-09-12 DIAGNOSIS — B353 Tinea pedis: Secondary | ICD-10-CM | POA: Diagnosis not present

## 2021-09-19 DIAGNOSIS — H109 Unspecified conjunctivitis: Secondary | ICD-10-CM | POA: Diagnosis not present

## 2021-10-14 DIAGNOSIS — L309 Dermatitis, unspecified: Secondary | ICD-10-CM | POA: Diagnosis not present

## 2021-10-18 ENCOUNTER — Other Ambulatory Visit: Payer: Self-pay

## 2021-10-18 ENCOUNTER — Other Ambulatory Visit: Payer: Self-pay | Admitting: Pharmacist

## 2021-10-18 ENCOUNTER — Ambulatory Visit (INDEPENDENT_AMBULATORY_CARE_PROVIDER_SITE_OTHER): Payer: BC Managed Care – PPO | Admitting: Pharmacist

## 2021-10-18 ENCOUNTER — Other Ambulatory Visit: Payer: BC Managed Care – PPO

## 2021-10-18 DIAGNOSIS — Z79899 Other long term (current) drug therapy: Secondary | ICD-10-CM

## 2021-10-18 DIAGNOSIS — Z113 Encounter for screening for infections with a predominantly sexual mode of transmission: Secondary | ICD-10-CM

## 2021-10-18 MED ORDER — CABOTEGRAVIR ER 600 MG/3ML IM SUER
600.0000 mg | Freq: Once | INTRAMUSCULAR | Status: AC
  Administered 2021-10-18: 600 mg via INTRAMUSCULAR

## 2021-10-18 NOTE — Progress Notes (Signed)
? ?HPI: Michael Duncan is a 41 y.o. male who presents to the [facility] pharmacy clinic for Apretude administration and HIV PrEP follow up. ? ?Patient Active Problem List  ? Diagnosis Date Noted  ? Male erectile disorder (CODE) 05/27/2021  ? Male infertility 05/27/2021  ? Tension type headache 05/27/2021  ? Chronic pansinusitis 12/03/2017  ? Deviated septum 12/03/2017  ? Hypertrophy, nasal, turbinate 12/03/2017  ? ? ?Patient's Medications  ?New Prescriptions  ? No medications on file  ?Previous Medications  ? CABOTEGRAVIR ER (APRETUDE) 600 MG/3ML INJECTION    Inject 3 mLs (600 mg total) into the muscle every 2 (two) months.  ? DICLOFENAC (VOLTAREN) 75 MG EC TABLET    Take 1 tablet (75 mg total) by mouth 2 (two) times daily.  ? DOXYCYCLINE (VIBRA-TABS) 100 MG TABLET    Take 1 tablet (100 mg total) by mouth 2 (two) times daily.  ? SILDENAFIL (REVATIO) 20 MG TABLET    See admin instructions.  ?Modified Medications  ? No medications on file  ?Discontinued Medications  ? No medications on file  ? ? ?Allergies: ?No Known Allergies ? ?Past Medical History: ?No past medical history on file. ? ?Social History: ?Social History  ? ?Socioeconomic History  ? Marital status: Married  ?  Spouse name: Not on file  ? Number of children: Not on file  ? Years of education: Not on file  ? Highest education level: Not on file  ?Occupational History  ? Not on file  ?Tobacco Use  ? Smoking status: Never  ? Smokeless tobacco: Never  ?Vaping Use  ? Vaping Use: Never used  ?Substance and Sexual Activity  ? Alcohol use: No  ? Drug use: No  ? Sexual activity: Not on file  ?Other Topics Concern  ? Not on file  ?Social History Narrative  ? Not on file  ? ?Social Determinants of Health  ? ?Financial Resource Strain: Not on file  ?Food Insecurity: Not on file  ?Transportation Needs: Not on file  ?Physical Activity: Not on file  ?Stress: Not on file  ?Social Connections: Not on file  ? ? ?Labs: ?Lab Results  ?Component Value Date  ? HIV1RNAQUANT Not  Detected 08/16/2021  ? HIV1RNAQUANT Not Detected 06/13/2021  ? HIV1RNAQUANT Not Detected 04/19/2021  ? ? ?RPR and STI ?Lab Results  ?Component Value Date  ? LABRPR NON-REACTIVE 06/13/2021  ? LABRPR NON-REACTIVE 02/16/2021  ? LABRPR NON-REACTIVE 12/09/2020  ? LABRPR NON-REACTIVE 07/01/2020  ? LABRPR NON-REACTIVE 09/16/2019  ? ? ?STI Results GC GC CT CT  ?06/13/2021 ?11:22 AM Negative    Negative     ?06/13/2021 ?11:12 AM Negative    ? Negative    Negative    ? Negative     ?02/16/2021 ?10:59 AM Negative    Negative     ?10/12/2016 ? 9:35 AM  NOT DETECTED    NOT DETECTED    ? ? ?Hepatitis B ?Lab Results  ?Component Value Date  ? HEPBSAB POS (A) 10/12/2016  ? HEPBSAG NEGATIVE 10/04/2016  ? HEPBCAB NON REACTIVE 10/12/2016  ? ?Hepatitis C ?Lab Results  ?Component Value Date  ? HEPCAB NON-REACTIVE 12/09/2020  ? ?Hepatitis A ?No results found for: HAV ?Lipids: ?Lab Results  ?Component Value Date  ? CHOL 140 10/16/2018  ? TRIG 76 10/16/2018  ? HDL 43 10/16/2018  ? CHOLHDL 3.3 10/16/2018  ? VLDL 13 10/12/2016  ? LDLCALC 81 10/16/2018  ? ? ?TARGET DATE: ?24th of the month ? ?Assessment: ?[name] presents today  for their Apretude injection and to follow up for HIV PrEP. No issues with past injections. No known exposures to any STIs and no signs or symptoms of any STIs today. Last STI screening was 08/16/21 and was negative. Screened patient for acute HIV symptoms such as fatigue, muscle aches, rash, sore throat, lymphadenopathy, headache, night sweats, nausea/vomiting/diarrhea, and fever. Patient denies any symptoms. Patient declines oral/rectal swabs today. Got RPR and urine for cytology today  Rapid HIV blood test was drawn immediately prior to injection and was negative. Patient also had HIV RNA drawn today.  ? ?Administered cabotegravir 600mg /39mL in right upper outer quadrant of the gluteal muscle. Monitored patient for 10 minutes after injection. Injection was tolerated well without issue. Will see him back in 2 months for  injection, labs, and HIV PrEP follow up. ? ?Plan: ?- Apretude injection administered ?- HIV RNA, RPR, and urine for GC/CT cytology today.  ?- Follow up with results to see if treatment is needed ?- Next injection, labs, and PrEP follow up appointment scheduled for 6/20 and 8/22 with [name] ?- Call with any issues or questions ? ?9/22, PharmD Candidate ?10/18/2021 10:40 AM ? ? ?

## 2021-10-19 ENCOUNTER — Other Ambulatory Visit (HOSPITAL_COMMUNITY)
Admission: RE | Admit: 2021-10-19 | Discharge: 2021-10-19 | Disposition: A | Discharge status: Home / Self Care | Payer: BC Managed Care – PPO | Source: Ambulatory Visit | Attending: Infectious Disease | Admitting: Infectious Disease

## 2021-10-19 DIAGNOSIS — Z113 Encounter for screening for infections with a predominantly sexual mode of transmission: Secondary | ICD-10-CM | POA: Diagnosis not present

## 2021-10-19 NOTE — Addendum Note (Signed)
Addended by: Marcell Anger on: 10/19/2021 11:15 AM ? ? Modules accepted: Orders ? ?

## 2021-10-20 LAB — URINE CYTOLOGY ANCILLARY ONLY
Chlamydia: NEGATIVE
Comment: NEGATIVE
Comment: NORMAL
Neisseria Gonorrhea: NEGATIVE

## 2021-10-20 LAB — HIV-1 RNA QUANT-NO REFLEX-BLD
HIV 1 RNA Quant: NOT DETECTED copies/mL
HIV-1 RNA Quant, Log: NOT DETECTED Log copies/mL

## 2021-10-20 LAB — RPR: RPR Ser Ql: NONREACTIVE

## 2021-10-31 DIAGNOSIS — Z3141 Encounter for fertility testing: Secondary | ICD-10-CM | POA: Diagnosis not present

## 2021-12-13 ENCOUNTER — Ambulatory Visit: Payer: BC Managed Care – PPO | Admitting: Pharmacist

## 2021-12-13 ENCOUNTER — Other Ambulatory Visit: Payer: BC Managed Care – PPO

## 2021-12-13 DIAGNOSIS — R21 Rash and other nonspecific skin eruption: Secondary | ICD-10-CM | POA: Diagnosis not present

## 2021-12-14 ENCOUNTER — Other Ambulatory Visit: Payer: Self-pay

## 2021-12-14 ENCOUNTER — Ambulatory Visit (INDEPENDENT_AMBULATORY_CARE_PROVIDER_SITE_OTHER): Payer: BC Managed Care – PPO | Admitting: Pharmacist

## 2021-12-14 ENCOUNTER — Other Ambulatory Visit: Payer: BC Managed Care – PPO

## 2021-12-14 DIAGNOSIS — Z79899 Other long term (current) drug therapy: Secondary | ICD-10-CM | POA: Diagnosis not present

## 2021-12-14 MED ORDER — CABOTEGRAVIR ER 600 MG/3ML IM SUER
600.0000 mg | Freq: Once | INTRAMUSCULAR | Status: AC
  Administered 2021-12-14: 600 mg via INTRAMUSCULAR

## 2021-12-14 NOTE — Progress Notes (Signed)
HPI: Michael Duncan is a 41 y.o. male who presents to the [facility] pharmacy clinic for Apretude administration and HIV PrEP follow up.  Patient Active Problem List   Diagnosis Date Noted   Male erectile disorder (CODE) 05/27/2021   Male infertility 05/27/2021   Tension type headache 05/27/2021   Chronic pansinusitis 12/03/2017   Deviated septum 12/03/2017   Hypertrophy, nasal, turbinate 12/03/2017    Patient's Medications  New Prescriptions   No medications on file  Previous Medications   CABOTEGRAVIR ER (APRETUDE) 600 MG/3ML INJECTION    Inject 3 mLs (600 mg total) into the muscle every 2 (two) months.   DICLOFENAC (VOLTAREN) 75 MG EC TABLET    Take 1 tablet (75 mg total) by mouth 2 (two) times daily.   DOXYCYCLINE (VIBRA-TABS) 100 MG TABLET    Take 1 tablet (100 mg total) by mouth 2 (two) times daily.   SILDENAFIL (REVATIO) 20 MG TABLET    See admin instructions.  Modified Medications   No medications on file  Discontinued Medications   No medications on file    Allergies: No Known Allergies  Past Medical History: No past medical history on file.  Social History: Social History   Socioeconomic History   Marital status: Married    Spouse name: Not on file   Number of children: Not on file   Years of education: Not on file   Highest education level: Not on file  Occupational History   Not on file  Tobacco Use   Smoking status: Never   Smokeless tobacco: Never  Vaping Use   Vaping Use: Never used  Substance and Sexual Activity   Alcohol use: No   Drug use: No   Sexual activity: Not on file  Other Topics Concern   Not on file  Social History Narrative   Not on file   Social Determinants of Health   Financial Resource Strain: Not on file  Food Insecurity: Not on file  Transportation Needs: Not on file  Physical Activity: Not on file  Stress: Not on file  Social Connections: Not on file    Labs: Lab Results  Component Value Date   HIV1RNAQUANT NOT  DETECTED 10/18/2021   HIV1RNAQUANT Not Detected 08/16/2021   HIV1RNAQUANT Not Detected 06/13/2021    RPR and STI Lab Results  Component Value Date   LABRPR NON-REACTIVE 10/18/2021   LABRPR NON-REACTIVE 06/13/2021   LABRPR NON-REACTIVE 02/16/2021   LABRPR NON-REACTIVE 12/09/2020   LABRPR NON-REACTIVE 07/01/2020    STI Results GC GC CT CT  10/19/2021 11:15 AM Negative   Negative    06/13/2021 11:22 AM Negative   Negative    06/13/2021 11:12 AM Negative    Negative   Negative    Negative    02/16/2021 10:59 AM Negative   Negative    10/12/2016  9:35 AM  NOT DETECTED   NOT DETECTED     Hepatitis B Lab Results  Component Value Date   HEPBSAB POS (A) 10/12/2016   HEPBSAG NEGATIVE 10/04/2016   HEPBCAB NON REACTIVE 10/12/2016   Hepatitis C Lab Results  Component Value Date   HEPCAB NON-REACTIVE 12/09/2020   Hepatitis A No results found for: "HAV" Lipids: Lab Results  Component Value Date   CHOL 140 10/16/2018   TRIG 76 10/16/2018   HDL 43 10/16/2018   CHOLHDL 3.3 10/16/2018   VLDL 13 10/12/2016   LDLCALC 81 10/16/2018    TARGET DATE: The 24th  Assessment: Michael Duncan presents today for his  Apretude injection and to follow up for HIV PrEP. No issues with past injections. No known exposures to any STIs and no signs or symptoms of any STIs today. Last STI screening was 10/19/21 and was negative. Screened for acute HIV symptoms such as fatigue, muscle aches, rash, sore throat, lymphadenopathy, headache, night sweats, nausea/vomiting/diarrhea, and fever. Denies any symptoms. No new partners since last injection. Rapid HIV blood test was drawn immediately prior to injection and was negative. HIV RNA collected today as well.  Administered cabotegravir 600mg /16mL in right upper outer quadrant of the gluteal muscle. Injection was tolerated well without issue. Will see him back in 2 months for injection, labs, and HIV PrEP follow up.  Plan: - Apretude injection administered - HIV  RNA today - Next injection, labs, and PrEP follow up appointment scheduled for 02/14/22 and 04/12/22 with me - Call with any issues or questions  Tatanisha Cuthbert L. Lyle Niblett, PharmD, BCIDP, AAHIVP, CPP Clinical Pharmacist Practitioner Infectious Diseases Clinical Pharmacist [facility]

## 2021-12-18 LAB — HIV-1 RNA QUANT-NO REFLEX-BLD
HIV 1 RNA Quant: NOT DETECTED Copies/mL
HIV-1 RNA Quant, Log: NOT DETECTED Log cps/mL

## 2021-12-22 DIAGNOSIS — M9903 Segmental and somatic dysfunction of lumbar region: Secondary | ICD-10-CM | POA: Diagnosis not present

## 2021-12-22 DIAGNOSIS — M9901 Segmental and somatic dysfunction of cervical region: Secondary | ICD-10-CM | POA: Diagnosis not present

## 2021-12-22 DIAGNOSIS — M9902 Segmental and somatic dysfunction of thoracic region: Secondary | ICD-10-CM | POA: Diagnosis not present

## 2021-12-23 DIAGNOSIS — M9902 Segmental and somatic dysfunction of thoracic region: Secondary | ICD-10-CM | POA: Diagnosis not present

## 2021-12-23 DIAGNOSIS — M9903 Segmental and somatic dysfunction of lumbar region: Secondary | ICD-10-CM | POA: Diagnosis not present

## 2021-12-23 DIAGNOSIS — M9901 Segmental and somatic dysfunction of cervical region: Secondary | ICD-10-CM | POA: Diagnosis not present

## 2021-12-28 DIAGNOSIS — M9901 Segmental and somatic dysfunction of cervical region: Secondary | ICD-10-CM | POA: Diagnosis not present

## 2021-12-28 DIAGNOSIS — M9902 Segmental and somatic dysfunction of thoracic region: Secondary | ICD-10-CM | POA: Diagnosis not present

## 2021-12-28 DIAGNOSIS — M9903 Segmental and somatic dysfunction of lumbar region: Secondary | ICD-10-CM | POA: Diagnosis not present

## 2021-12-30 DIAGNOSIS — M9901 Segmental and somatic dysfunction of cervical region: Secondary | ICD-10-CM | POA: Diagnosis not present

## 2021-12-30 DIAGNOSIS — M9903 Segmental and somatic dysfunction of lumbar region: Secondary | ICD-10-CM | POA: Diagnosis not present

## 2021-12-30 DIAGNOSIS — M9902 Segmental and somatic dysfunction of thoracic region: Secondary | ICD-10-CM | POA: Diagnosis not present

## 2022-01-04 DIAGNOSIS — M9902 Segmental and somatic dysfunction of thoracic region: Secondary | ICD-10-CM | POA: Diagnosis not present

## 2022-01-04 DIAGNOSIS — M9903 Segmental and somatic dysfunction of lumbar region: Secondary | ICD-10-CM | POA: Diagnosis not present

## 2022-01-04 DIAGNOSIS — M9901 Segmental and somatic dysfunction of cervical region: Secondary | ICD-10-CM | POA: Diagnosis not present

## 2022-01-06 DIAGNOSIS — M9902 Segmental and somatic dysfunction of thoracic region: Secondary | ICD-10-CM | POA: Diagnosis not present

## 2022-01-06 DIAGNOSIS — M9903 Segmental and somatic dysfunction of lumbar region: Secondary | ICD-10-CM | POA: Diagnosis not present

## 2022-01-06 DIAGNOSIS — M9901 Segmental and somatic dysfunction of cervical region: Secondary | ICD-10-CM | POA: Diagnosis not present

## 2022-01-10 DIAGNOSIS — M9902 Segmental and somatic dysfunction of thoracic region: Secondary | ICD-10-CM | POA: Diagnosis not present

## 2022-01-10 DIAGNOSIS — M9903 Segmental and somatic dysfunction of lumbar region: Secondary | ICD-10-CM | POA: Diagnosis not present

## 2022-01-10 DIAGNOSIS — M9901 Segmental and somatic dysfunction of cervical region: Secondary | ICD-10-CM | POA: Diagnosis not present

## 2022-01-10 DIAGNOSIS — N840 Polyp of corpus uteri: Secondary | ICD-10-CM | POA: Diagnosis not present

## 2022-01-10 DIAGNOSIS — Z113 Encounter for screening for infections with a predominantly sexual mode of transmission: Secondary | ICD-10-CM | POA: Diagnosis not present

## 2022-01-10 DIAGNOSIS — Z319 Encounter for procreative management, unspecified: Secondary | ICD-10-CM | POA: Diagnosis not present

## 2022-01-10 DIAGNOSIS — Z3183 Encounter for assisted reproductive fertility procedure cycle: Secondary | ICD-10-CM | POA: Diagnosis not present

## 2022-01-13 DIAGNOSIS — M9902 Segmental and somatic dysfunction of thoracic region: Secondary | ICD-10-CM | POA: Diagnosis not present

## 2022-01-13 DIAGNOSIS — M9901 Segmental and somatic dysfunction of cervical region: Secondary | ICD-10-CM | POA: Diagnosis not present

## 2022-01-13 DIAGNOSIS — M9903 Segmental and somatic dysfunction of lumbar region: Secondary | ICD-10-CM | POA: Diagnosis not present

## 2022-01-16 DIAGNOSIS — M9902 Segmental and somatic dysfunction of thoracic region: Secondary | ICD-10-CM | POA: Diagnosis not present

## 2022-01-16 DIAGNOSIS — M9901 Segmental and somatic dysfunction of cervical region: Secondary | ICD-10-CM | POA: Diagnosis not present

## 2022-01-16 DIAGNOSIS — M9903 Segmental and somatic dysfunction of lumbar region: Secondary | ICD-10-CM | POA: Diagnosis not present

## 2022-01-20 DIAGNOSIS — M9901 Segmental and somatic dysfunction of cervical region: Secondary | ICD-10-CM | POA: Diagnosis not present

## 2022-01-20 DIAGNOSIS — M9903 Segmental and somatic dysfunction of lumbar region: Secondary | ICD-10-CM | POA: Diagnosis not present

## 2022-01-20 DIAGNOSIS — M9902 Segmental and somatic dysfunction of thoracic region: Secondary | ICD-10-CM | POA: Diagnosis not present

## 2022-01-23 DIAGNOSIS — M9903 Segmental and somatic dysfunction of lumbar region: Secondary | ICD-10-CM | POA: Diagnosis not present

## 2022-01-23 DIAGNOSIS — M9902 Segmental and somatic dysfunction of thoracic region: Secondary | ICD-10-CM | POA: Diagnosis not present

## 2022-01-23 DIAGNOSIS — M9901 Segmental and somatic dysfunction of cervical region: Secondary | ICD-10-CM | POA: Diagnosis not present

## 2022-01-27 DIAGNOSIS — M9902 Segmental and somatic dysfunction of thoracic region: Secondary | ICD-10-CM | POA: Diagnosis not present

## 2022-01-27 DIAGNOSIS — M9901 Segmental and somatic dysfunction of cervical region: Secondary | ICD-10-CM | POA: Diagnosis not present

## 2022-01-27 DIAGNOSIS — M9903 Segmental and somatic dysfunction of lumbar region: Secondary | ICD-10-CM | POA: Diagnosis not present

## 2022-01-30 DIAGNOSIS — M9902 Segmental and somatic dysfunction of thoracic region: Secondary | ICD-10-CM | POA: Diagnosis not present

## 2022-01-30 DIAGNOSIS — M9903 Segmental and somatic dysfunction of lumbar region: Secondary | ICD-10-CM | POA: Diagnosis not present

## 2022-01-30 DIAGNOSIS — M9901 Segmental and somatic dysfunction of cervical region: Secondary | ICD-10-CM | POA: Diagnosis not present

## 2022-02-03 DIAGNOSIS — M9901 Segmental and somatic dysfunction of cervical region: Secondary | ICD-10-CM | POA: Diagnosis not present

## 2022-02-03 DIAGNOSIS — M9902 Segmental and somatic dysfunction of thoracic region: Secondary | ICD-10-CM | POA: Diagnosis not present

## 2022-02-03 DIAGNOSIS — M9903 Segmental and somatic dysfunction of lumbar region: Secondary | ICD-10-CM | POA: Diagnosis not present

## 2022-02-06 DIAGNOSIS — M9902 Segmental and somatic dysfunction of thoracic region: Secondary | ICD-10-CM | POA: Diagnosis not present

## 2022-02-06 DIAGNOSIS — M9903 Segmental and somatic dysfunction of lumbar region: Secondary | ICD-10-CM | POA: Diagnosis not present

## 2022-02-06 DIAGNOSIS — M9901 Segmental and somatic dysfunction of cervical region: Secondary | ICD-10-CM | POA: Diagnosis not present

## 2022-02-06 DIAGNOSIS — A630 Anogenital (venereal) warts: Secondary | ICD-10-CM | POA: Diagnosis not present

## 2022-02-06 DIAGNOSIS — L0889 Other specified local infections of the skin and subcutaneous tissue: Secondary | ICD-10-CM | POA: Diagnosis not present

## 2022-02-06 DIAGNOSIS — L738 Other specified follicular disorders: Secondary | ICD-10-CM | POA: Diagnosis not present

## 2022-02-06 DIAGNOSIS — B354 Tinea corporis: Secondary | ICD-10-CM | POA: Diagnosis not present

## 2022-02-09 DIAGNOSIS — M9902 Segmental and somatic dysfunction of thoracic region: Secondary | ICD-10-CM | POA: Diagnosis not present

## 2022-02-09 DIAGNOSIS — M9901 Segmental and somatic dysfunction of cervical region: Secondary | ICD-10-CM | POA: Diagnosis not present

## 2022-02-09 DIAGNOSIS — M9903 Segmental and somatic dysfunction of lumbar region: Secondary | ICD-10-CM | POA: Diagnosis not present

## 2022-02-14 ENCOUNTER — Ambulatory Visit (INDEPENDENT_AMBULATORY_CARE_PROVIDER_SITE_OTHER): Payer: BC Managed Care – PPO | Admitting: Pharmacist

## 2022-02-14 ENCOUNTER — Other Ambulatory Visit: Payer: BC Managed Care – PPO

## 2022-02-14 ENCOUNTER — Other Ambulatory Visit: Payer: Self-pay | Admitting: Pharmacist

## 2022-02-14 ENCOUNTER — Other Ambulatory Visit: Payer: Self-pay

## 2022-02-14 DIAGNOSIS — Z79899 Other long term (current) drug therapy: Secondary | ICD-10-CM | POA: Diagnosis not present

## 2022-02-14 DIAGNOSIS — M9903 Segmental and somatic dysfunction of lumbar region: Secondary | ICD-10-CM | POA: Diagnosis not present

## 2022-02-14 DIAGNOSIS — Z113 Encounter for screening for infections with a predominantly sexual mode of transmission: Secondary | ICD-10-CM

## 2022-02-14 DIAGNOSIS — M9901 Segmental and somatic dysfunction of cervical region: Secondary | ICD-10-CM | POA: Diagnosis not present

## 2022-02-14 DIAGNOSIS — M9902 Segmental and somatic dysfunction of thoracic region: Secondary | ICD-10-CM | POA: Diagnosis not present

## 2022-02-14 MED ORDER — CABOTEGRAVIR ER 600 MG/3ML IM SUER
600.0000 mg | Freq: Once | INTRAMUSCULAR | Status: AC
  Administered 2022-02-14: 600 mg via INTRAMUSCULAR

## 2022-02-14 NOTE — Progress Notes (Signed)
H

## 2022-02-14 NOTE — Progress Notes (Signed)
HPI: Michael Duncan is a 41 y.o. male who presents to the [facility] pharmacy clinic for Apretude administration and HIV PrEP follow up.  Patient Active Problem List   Diagnosis Date Noted   Male erectile disorder (CODE) 05/27/2021   Male infertility 05/27/2021   Tension type headache 05/27/2021   Chronic pansinusitis 12/03/2017   Deviated septum 12/03/2017   Hypertrophy, nasal, turbinate 12/03/2017    Patient's Medications  New Prescriptions   No medications on file  Previous Medications   CABOTEGRAVIR ER (APRETUDE) 600 MG/3ML INJECTION    Inject 3 mLs (600 mg total) into the muscle every 2 (two) months.   DICLOFENAC (VOLTAREN) 75 MG EC TABLET    Take 1 tablet (75 mg total) by mouth 2 (two) times daily.   DOXYCYCLINE (VIBRA-TABS) 100 MG TABLET    Take 1 tablet (100 mg total) by mouth 2 (two) times daily.   SILDENAFIL (REVATIO) 20 MG TABLET    See admin instructions.  Modified Medications   No medications on file  Discontinued Medications   No medications on file    Allergies: No Known Allergies  Past Medical History: No past medical history on file.  Social History: Social History   Socioeconomic History   Marital status: Married    Spouse name: Not on file   Number of children: Not on file   Years of education: Not on file   Highest education level: Not on file  Occupational History   Not on file  Tobacco Use   Smoking status: Never   Smokeless tobacco: Never  Vaping Use   Vaping Use: Never used  Substance and Sexual Activity   Alcohol use: No   Drug use: No   Sexual activity: Not on file  Other Topics Concern   Not on file  Social History Narrative   Not on file   Social Determinants of Health   Financial Resource Strain: Not on file  Food Insecurity: Not on file  Transportation Needs: Not on file  Physical Activity: Not on file  Stress: Not on file  Social Connections: Not on file    Labs: Lab Results  Component Value Date   HIV1RNAQUANT Not  Detected 12/14/2021   HIV1RNAQUANT NOT DETECTED 10/18/2021   HIV1RNAQUANT Not Detected 08/16/2021    RPR and STI Lab Results  Component Value Date   LABRPR NON-REACTIVE 10/18/2021   LABRPR NON-REACTIVE 06/13/2021   LABRPR NON-REACTIVE 02/16/2021   LABRPR NON-REACTIVE 12/09/2020   LABRPR NON-REACTIVE 07/01/2020    STI Results GC GC CT CT  10/19/2021 11:15 AM Negative   Negative    06/13/2021 11:22 AM Negative   Negative    06/13/2021 11:12 AM Negative    Negative   Negative    Negative    02/16/2021 10:59 AM Negative   Negative    10/12/2016  9:35 AM  NOT DETECTED   NOT DETECTED     Hepatitis B Lab Results  Component Value Date   HEPBSAB POS (A) 10/12/2016   HEPBSAG NEGATIVE 10/04/2016   HEPBCAB NON REACTIVE 10/12/2016   Hepatitis C Lab Results  Component Value Date   HEPCAB NON-REACTIVE 12/09/2020   Hepatitis A No results found for: "HAV" Lipids: Lab Results  Component Value Date   CHOL 140 10/16/2018   TRIG 76 10/16/2018   HDL 43 10/16/2018   CHOLHDL 3.3 10/16/2018   VLDL 13 10/12/2016   LDLCALC 81 10/16/2018    TARGET DATE: The 24th  Assessment: Michael Duncan presents today for his  Apretude injection and to follow up for HIV PrEP. No issues with past injections. No known exposures to any STIs and no signs or symptoms of any STIs today. Last STI screening was 10/19/21 and was negative. Screened for acute HIV symptoms such as fatigue, muscle aches, rash, sore throat, lymphadenopathy, headache, night sweats, nausea/vomiting/diarrhea, and fever. Denies any symptoms. No new partners since last injection, and he politely declines STI testing today. Rapid HIV blood test was drawn immediately prior to injection and was negative. HIV RNA collected today as well.  Administered cabotegravir 600mg /17mL in left upper outer quadrant of the gluteal muscle. Injection was tolerated well without issue. Will see him back in 2 months for injection, labs, and HIV PrEP follow  up.  Plan: - Apretude injection administered - HIV RNA today - Next injection, labs, and PrEP follow up appointment scheduled for 04/12/22 - Call with any issues or questions  Gabriana Wilmott L. Cristofher Livecchi, PharmD, BCIDP, AAHIVP, CPP Clinical Pharmacist Practitioner Infectious Diseases Clinical Pharmacist [facility]

## 2022-02-16 LAB — HIV-1 RNA QUANT-NO REFLEX-BLD
HIV 1 RNA Quant: NOT DETECTED Copies/mL
HIV-1 RNA Quant, Log: NOT DETECTED Log cps/mL

## 2022-02-21 DIAGNOSIS — M9903 Segmental and somatic dysfunction of lumbar region: Secondary | ICD-10-CM | POA: Diagnosis not present

## 2022-02-21 DIAGNOSIS — M9902 Segmental and somatic dysfunction of thoracic region: Secondary | ICD-10-CM | POA: Diagnosis not present

## 2022-02-21 DIAGNOSIS — M9901 Segmental and somatic dysfunction of cervical region: Secondary | ICD-10-CM | POA: Diagnosis not present

## 2022-03-01 DIAGNOSIS — M9902 Segmental and somatic dysfunction of thoracic region: Secondary | ICD-10-CM | POA: Diagnosis not present

## 2022-03-01 DIAGNOSIS — M9901 Segmental and somatic dysfunction of cervical region: Secondary | ICD-10-CM | POA: Diagnosis not present

## 2022-03-01 DIAGNOSIS — M9903 Segmental and somatic dysfunction of lumbar region: Secondary | ICD-10-CM | POA: Diagnosis not present

## 2022-03-07 DIAGNOSIS — M9902 Segmental and somatic dysfunction of thoracic region: Secondary | ICD-10-CM | POA: Diagnosis not present

## 2022-03-07 DIAGNOSIS — M9901 Segmental and somatic dysfunction of cervical region: Secondary | ICD-10-CM | POA: Diagnosis not present

## 2022-03-07 DIAGNOSIS — M9903 Segmental and somatic dysfunction of lumbar region: Secondary | ICD-10-CM | POA: Diagnosis not present

## 2022-03-15 DIAGNOSIS — M9901 Segmental and somatic dysfunction of cervical region: Secondary | ICD-10-CM | POA: Diagnosis not present

## 2022-03-15 DIAGNOSIS — M9903 Segmental and somatic dysfunction of lumbar region: Secondary | ICD-10-CM | POA: Diagnosis not present

## 2022-03-15 DIAGNOSIS — M9902 Segmental and somatic dysfunction of thoracic region: Secondary | ICD-10-CM | POA: Diagnosis not present

## 2022-03-30 DIAGNOSIS — M9903 Segmental and somatic dysfunction of lumbar region: Secondary | ICD-10-CM | POA: Diagnosis not present

## 2022-03-30 DIAGNOSIS — M9901 Segmental and somatic dysfunction of cervical region: Secondary | ICD-10-CM | POA: Diagnosis not present

## 2022-03-30 DIAGNOSIS — M9902 Segmental and somatic dysfunction of thoracic region: Secondary | ICD-10-CM | POA: Diagnosis not present

## 2022-04-04 DIAGNOSIS — M9901 Segmental and somatic dysfunction of cervical region: Secondary | ICD-10-CM | POA: Diagnosis not present

## 2022-04-04 DIAGNOSIS — M9903 Segmental and somatic dysfunction of lumbar region: Secondary | ICD-10-CM | POA: Diagnosis not present

## 2022-04-04 DIAGNOSIS — M9902 Segmental and somatic dysfunction of thoracic region: Secondary | ICD-10-CM | POA: Diagnosis not present

## 2022-04-11 DIAGNOSIS — M9901 Segmental and somatic dysfunction of cervical region: Secondary | ICD-10-CM | POA: Diagnosis not present

## 2022-04-11 DIAGNOSIS — M9903 Segmental and somatic dysfunction of lumbar region: Secondary | ICD-10-CM | POA: Diagnosis not present

## 2022-04-11 DIAGNOSIS — M9902 Segmental and somatic dysfunction of thoracic region: Secondary | ICD-10-CM | POA: Diagnosis not present

## 2022-04-12 ENCOUNTER — Ambulatory Visit: Payer: BC Managed Care – PPO | Admitting: Pharmacist

## 2022-04-12 ENCOUNTER — Other Ambulatory Visit: Payer: BC Managed Care – PPO

## 2022-04-17 ENCOUNTER — Ambulatory Visit (INDEPENDENT_AMBULATORY_CARE_PROVIDER_SITE_OTHER): Payer: BC Managed Care – PPO | Admitting: Pharmacist

## 2022-04-17 ENCOUNTER — Other Ambulatory Visit: Payer: Self-pay

## 2022-04-17 DIAGNOSIS — Z79899 Other long term (current) drug therapy: Secondary | ICD-10-CM

## 2022-04-17 MED ORDER — CABOTEGRAVIR ER 600 MG/3ML IM SUER
600.0000 mg | Freq: Once | INTRAMUSCULAR | Status: AC
  Administered 2022-04-17: 600 mg via INTRAMUSCULAR

## 2022-04-17 NOTE — Progress Notes (Deleted)
HPI: Michael Duncan is a 41 y.o. male who presents to the [facility] clinic for Apretude administration and HIV PrEP follow up.  Patient Active Problem List   Diagnosis Date Noted   Male erectile disorder (CODE) 05/27/2021   Male infertility 05/27/2021   Tension type headache 05/27/2021   Chronic pansinusitis 12/03/2017   Deviated septum 12/03/2017   Hypertrophy, nasal, turbinate 12/03/2017    Patient's Medications  New Prescriptions   No medications on file  Previous Medications   CABOTEGRAVIR ER (APRETUDE) 600 MG/3ML INJECTION    Inject 3 mLs (600 mg total) into the muscle every 2 (two) months.   DICLOFENAC (VOLTAREN) 75 MG EC TABLET    Take 1 tablet (75 mg total) by mouth 2 (two) times daily.   DOXYCYCLINE (VIBRA-TABS) 100 MG TABLET    Take 1 tablet (100 mg total) by mouth 2 (two) times daily.   SILDENAFIL (REVATIO) 20 MG TABLET    See admin instructions.  Modified Medications   No medications on file  Discontinued Medications   No medications on file    Allergies: No Known Allergies  Past Medical History: No past medical history on file.  Social History: Social History   Socioeconomic History   Marital status: Married    Spouse name: Not on file   Number of children: Not on file   Years of education: Not on file   Highest education level: Not on file  Occupational History   Not on file  Tobacco Use   Smoking status: Never   Smokeless tobacco: Never  Vaping Use   Vaping Use: Never used  Substance and Sexual Activity   Alcohol use: No   Drug use: No   Sexual activity: Not on file  Other Topics Concern   Not on file  Social History Narrative   Not on file   Social Determinants of Health   Financial Resource Strain: Not on file  Food Insecurity: Not on file  Transportation Needs: Not on file  Physical Activity: Not on file  Stress: Not on file  Social Connections: Not on file    Labs: Lab Results  Component Value Date   HIV1RNAQUANT Not  Detected 02/14/2022   HIV1RNAQUANT Not Detected 12/14/2021   HIV1RNAQUANT NOT DETECTED 10/18/2021    RPR and STI Lab Results  Component Value Date   LABRPR NON-REACTIVE 10/18/2021   LABRPR NON-REACTIVE 06/13/2021   LABRPR NON-REACTIVE 02/16/2021   LABRPR NON-REACTIVE 12/09/2020   LABRPR NON-REACTIVE 07/01/2020    STI Results GC GC CT CT  10/19/2021 11:15 AM Negative   Negative    06/13/2021 11:22 AM Negative   Negative    06/13/2021 11:12 AM Negative    Negative   Negative    Negative    02/16/2021 10:59 AM Negative   Negative    10/12/2016  9:35 AM  NOT DETECTED   NOT DETECTED     Hepatitis B Lab Results  Component Value Date   HEPBSAB POS (A) 10/12/2016   HEPBSAG NEGATIVE 10/04/2016   HEPBCAB NON REACTIVE 10/12/2016   Hepatitis C Lab Results  Component Value Date   HEPCAB NON-REACTIVE 12/09/2020   Hepatitis A No results found for: "HAV" Lipids: Lab Results  Component Value Date   CHOL 140 10/16/2018   TRIG 76 10/16/2018   HDL 43 10/16/2018   CHOLHDL 3.3 10/16/2018   VLDL 13 10/12/2016   [place] 81 10/16/2018    TARGET DATE: The 24th  Assessment: Michael Duncan presents today for his  Apretude injection and to follow up for HIV PrEP. No issues with past injections. Screened for acute HIV symptoms such as fatigue, muscle aches, rash, sore throat, lymphadenopathy, headache, night sweats, nausea/vomiting/diarrhea, and fever. Denies any symptoms. No known exposures to any STIs and no signs or symptoms of any STIs today. Politely declines STI testing today. Offered annual flu vaccine and COVID vaccines today, but he states that he has an appointment with his primary care doctor for them.   Per [employer] guidelines, a rapid HIV test should be drawn prior to Apretude administration. Due to state shortage of rapid HIV tests, this is temporarily unable to be done. Per decision from [facility] physicians, we will proceed with Apretude administration at this time without a negative  rapid HIV test beforehand. HIV RNA was collected today and is in process.  Administered cabotegravir 600mg /65mL in right upper outer quadrant of the gluteal muscle. Injection was tolerated well without issue. Will see him back in 2 months for injection, labs, and HIV PrEP follow up.  Plan: - Apretude injection administered - HIV RNA today - Next injection, labs, and PrEP follow up appointment scheduled for 06/12/22 - Call with any issues or questions  Maxine Huynh L. Pau Banh, PharmD, BCIDP, AAHIVP, CPP Clinical Pharmacist Practitioner Infectious Diseases Clinical Pharmacist [facility]

## 2022-04-17 NOTE — Progress Notes (Signed)
HPI: Michael Duncan is a 41 y.o. male who presents to the [facility] clinic for Apretude administration and HIV PrEP follow up.  Patient Active Problem List   Diagnosis Date Noted   Male erectile disorder (CODE) 05/27/2021   Male infertility 05/27/2021   Tension type headache 05/27/2021   Chronic pansinusitis 12/03/2017   Deviated septum 12/03/2017   Hypertrophy, nasal, turbinate 12/03/2017    Patient's Medications  New Prescriptions   No medications on file  Previous Medications   CABOTEGRAVIR ER (APRETUDE) 600 MG/3ML INJECTION    Inject 3 mLs (600 mg total) into the muscle every 2 (two) months.   DICLOFENAC (VOLTAREN) 75 MG EC TABLET    Take 1 tablet (75 mg total) by mouth 2 (two) times daily.   DOXYCYCLINE (VIBRA-TABS) 100 MG TABLET    Take 1 tablet (100 mg total) by mouth 2 (two) times daily.   SILDENAFIL (REVATIO) 20 MG TABLET    See admin instructions.  Modified Medications   No medications on file  Discontinued Medications   No medications on file    Allergies: No Known Allergies  Past Medical History: No past medical history on file.  Social History: Social History   Socioeconomic History   Marital status: Married    Spouse name: Not on file   Number of children: Not on file   Years of education: Not on file   Highest education level: Not on file  Occupational History   Not on file  Tobacco Use   Smoking status: Never   Smokeless tobacco: Never  Vaping Use   Vaping Use: Never used  Substance and Sexual Activity   Alcohol use: No   Drug use: No   Sexual activity: Not on file  Other Topics Concern   Not on file  Social History Narrative   Not on file   Social Determinants of Health   Financial Resource Strain: Not on file  Food Insecurity: Not on file  Transportation Needs: Not on file  Physical Activity: Not on file  Stress: Not on file  Social Connections: Not on file    Labs: Lab Results  Component Value Date   HIV1RNAQUANT Not  Detected 02/14/2022   HIV1RNAQUANT Not Detected 12/14/2021   HIV1RNAQUANT NOT DETECTED 10/18/2021    RPR and STI Lab Results  Component Value Date   LABRPR NON-REACTIVE 10/18/2021   LABRPR NON-REACTIVE 06/13/2021   LABRPR NON-REACTIVE 02/16/2021   LABRPR NON-REACTIVE 12/09/2020   LABRPR NON-REACTIVE 07/01/2020    STI Results GC GC CT CT  10/19/2021 11:15 AM Negative   Negative    06/13/2021 11:22 AM Negative   Negative    06/13/2021 11:12 AM Negative    Negative   Negative    Negative    02/16/2021 10:59 AM Negative   Negative    10/12/2016  9:35 AM  NOT DETECTED   NOT DETECTED     Hepatitis B Lab Results  Component Value Date   HEPBSAB POS (A) 10/12/2016   HEPBSAG NEGATIVE 10/04/2016   HEPBCAB NON REACTIVE 10/12/2016   Hepatitis C Lab Results  Component Value Date   HEPCAB NON-REACTIVE 12/09/2020   Hepatitis A No results found for: "HAV" Lipids: Lab Results  Component Value Date   CHOL 140 10/16/2018   TRIG 76 10/16/2018   HDL 43 10/16/2018   CHOLHDL 3.3 10/16/2018   VLDL 13 10/12/2016   [place] 81 10/16/2018    TARGET DATE: The 24th  Assessment: Michael Duncan presents today for his  Apretude injection and to follow up for HIV PrEP. No issues with past injections. Screened for acute HIV symptoms such as fatigue, muscle aches, rash, sore throat, lymphadenopathy, headache, night sweats, nausea/vomiting/diarrhea, and fever. Denies any symptoms. No known exposures to any STIs and no signs or symptoms of any STIs today. Politely declines testing today. Offered annual flu and COVID vaccines, but he states that he has an appointment for them with his primary care doctor.  Per [employer] guidelines, a rapid HIV test should be drawn prior to Apretude administration. Due to state shortage of rapid HIV tests, this is temporarily unable to be done. Per decision from [facility] physicians, we will proceed with Apretude administration at this time without a negative rapid HIV test  beforehand. HIV RNA was collected today and is in process.  Administered cabotegravir 600mg /55mL in right upper outer quadrant of the gluteal muscle. Injection was tolerated well without issue. Will see him back in 2 months for injection, labs, and HIV PrEP follow up.  Plan: - Apretude injection administered - HIV RNA today - Next injection, labs, and PrEP follow up appointment scheduled for 06/14/22 - Call with any issues or questions  Michael Duncan L. Michael Duncan, PharmD, BCIDP, AAHIVP, CPP Clinical Pharmacist Practitioner Infectious Diseases Clinical Pharmacist [facility]

## 2022-04-19 ENCOUNTER — Ambulatory Visit (INDEPENDENT_AMBULATORY_CARE_PROVIDER_SITE_OTHER): Payer: BC Managed Care – PPO | Admitting: Podiatry

## 2022-04-19 ENCOUNTER — Encounter: Payer: Self-pay | Admitting: Podiatry

## 2022-04-19 DIAGNOSIS — L309 Dermatitis, unspecified: Secondary | ICD-10-CM | POA: Diagnosis not present

## 2022-04-19 DIAGNOSIS — M779 Enthesopathy, unspecified: Secondary | ICD-10-CM | POA: Diagnosis not present

## 2022-04-19 LAB — HIV-1 RNA QUANT-NO REFLEX-BLD
HIV 1 RNA Quant: NOT DETECTED Copies/mL
HIV-1 RNA Quant, Log: NOT DETECTED Log cps/mL

## 2022-04-19 MED ORDER — TERBINAFINE HCL 250 MG PO TABS: ORAL_TABLET | ORAL | 0 refills | Status: DC

## 2022-04-19 NOTE — Progress Notes (Signed)
Patient subjective:   Patient ID: Michael Duncan, male   DOB: 41 y.o.   MRN: [id number]   HPI Complains of odor and sweating of both feet and inflammation of a generalized nature in the forefoot bilateral admitting that he stopped wearing his orthotics when his toes came down   ROS      Objective:  Physical Exam  Neurovascular status intact odor noted bilateral mild sweating possibility for small fungal infiltration between the digits and mild inflammation of the lesser MPJs bilateral no specific area     Assessment:  Number of different problems with sweating and odor and also probability for inflammation possible fungal infection     Plan:  H&P reviewed all conditions with him.  At this point we are just going to treat conservatively and I went ahead today and I want to start him on a fungal pulse pack and we discussed at great length different shoe gear changes sock changes and over-the-counter remedies.  Very difficult to get rid of this entirely may need to get more aggressive on his inflammatory condition if symptoms do not get better but I want him to start his orthotics again

## 2022-04-20 IMAGING — CT CT HEAD W/O CM
3 series · 15 of 47 positions shown, 18 images · non-contrast
Comparison: None.

CLINICAL DATA: Left facial weakness

EXAM:
CT HEAD WITHOUT CONTRAST
TECHNIQUE: Contiguous axial images were obtained from the base of the skull
through the vertex without intravenous contrast.

[Series 2: head wo · axial · 0.44mm/px · z∈[-142,-17]mm · 9 of 30 slices shown, 12 images]
[im 3/30  brain]
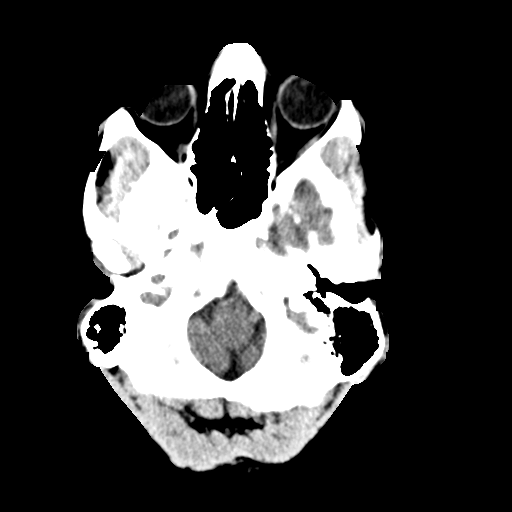
[im 3/30  bone]
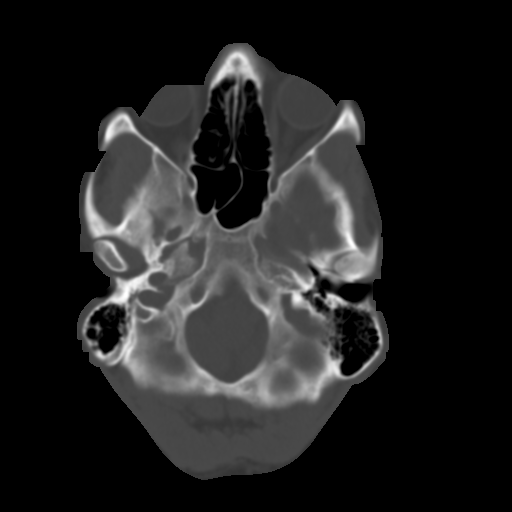
[im 6/30  brain]
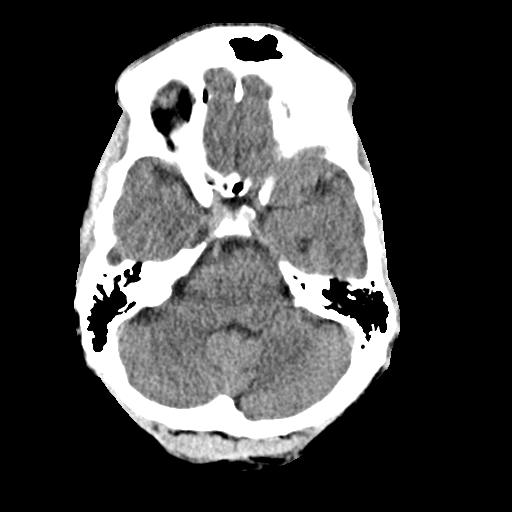
[im 9/30  brain]
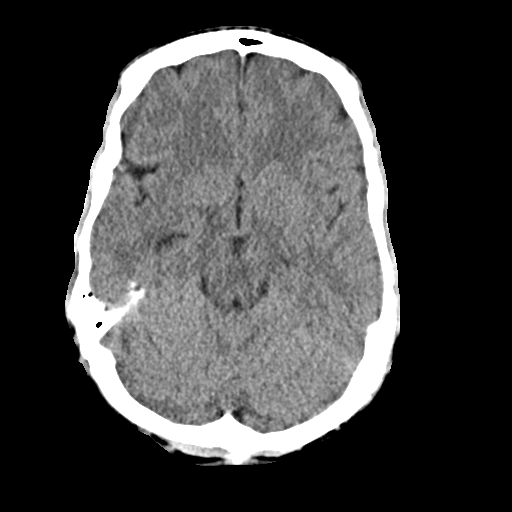
[im 12/30  brain]
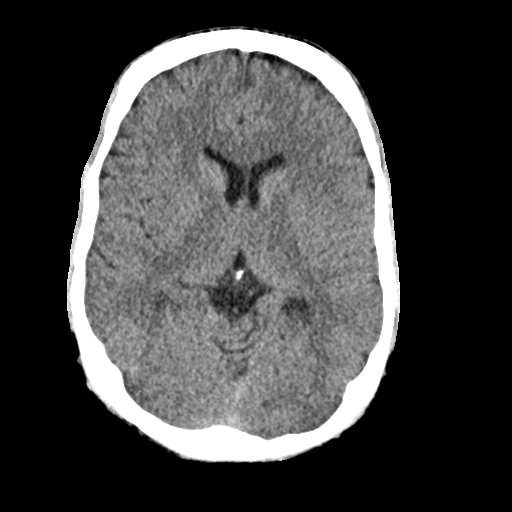
[im 16/30  brain]
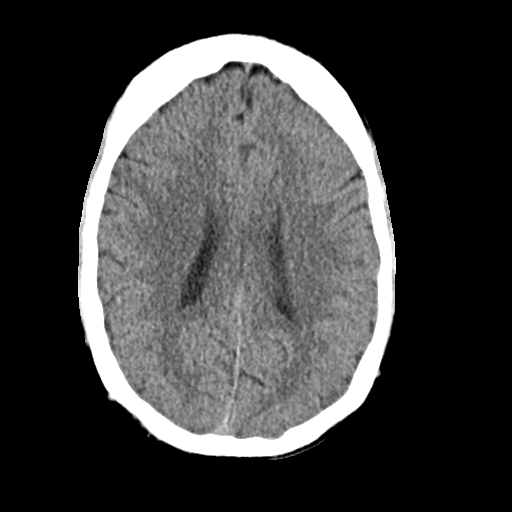
[im 16/30  bone]
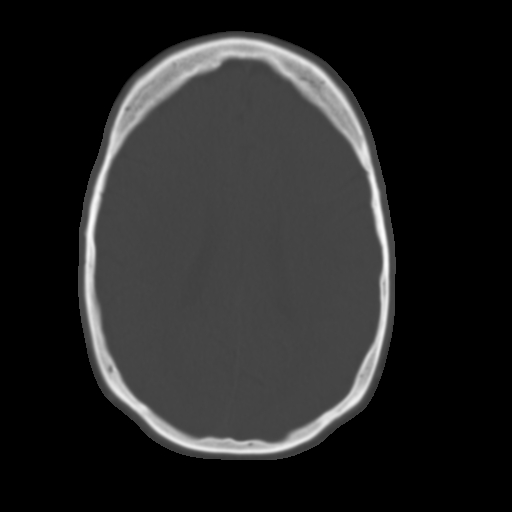
[im 19/30  brain]
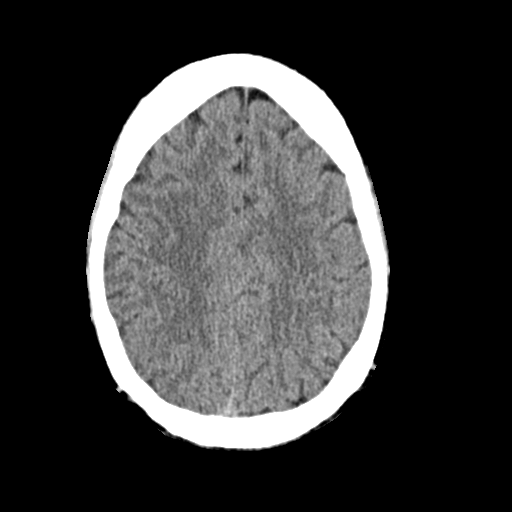
[im 22/30  brain]
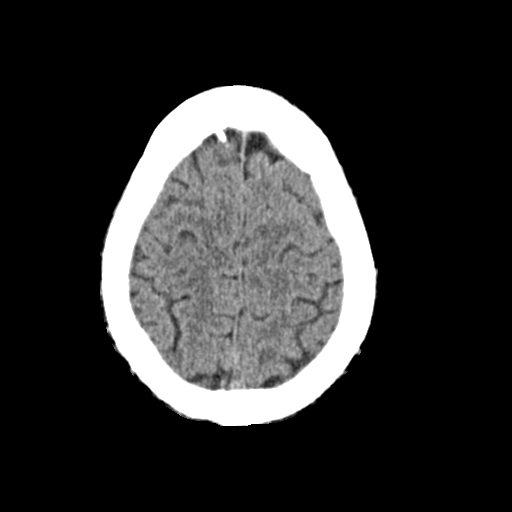
[im 25/30  brain]
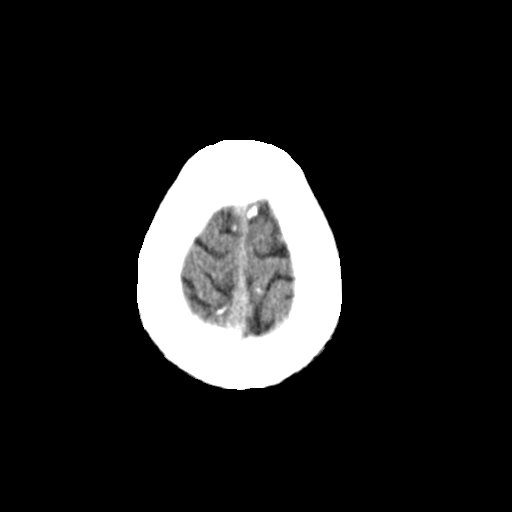
[im 28/30  brain]
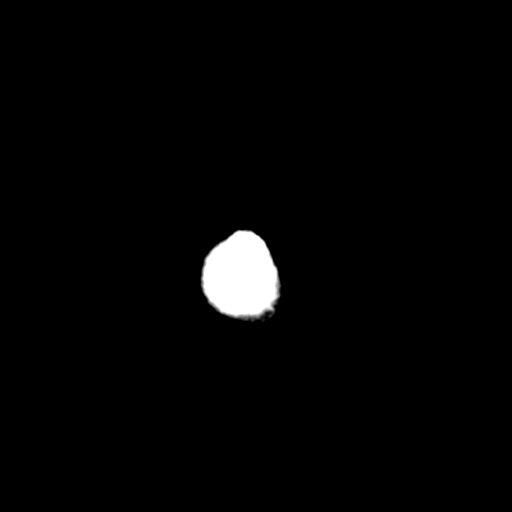
[im 28/30  bone]
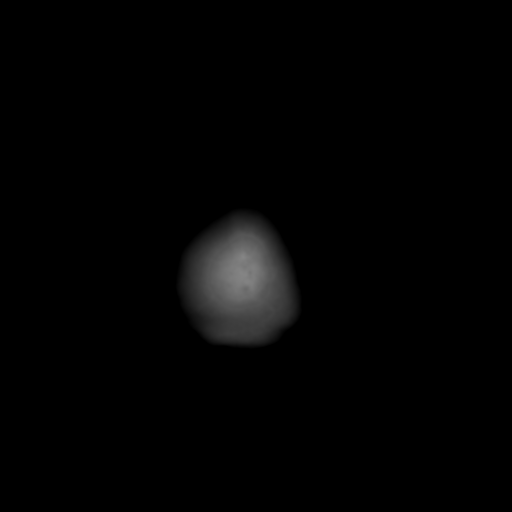

[Series 4: coronal soft · coronal · 0.31mm/px · 3 of 69 slices shown]
[im 23/69  brain]
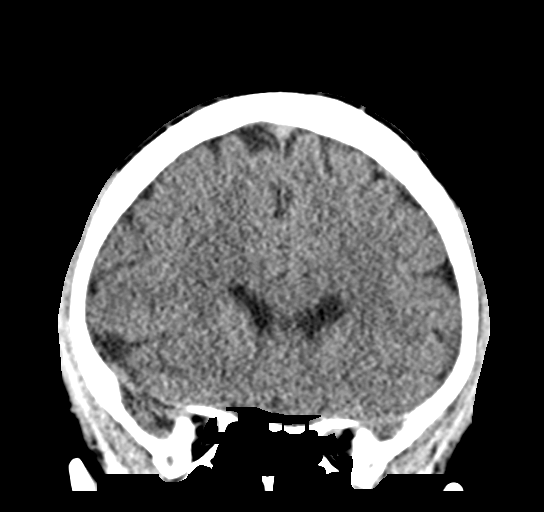
[im 31/69  brain]
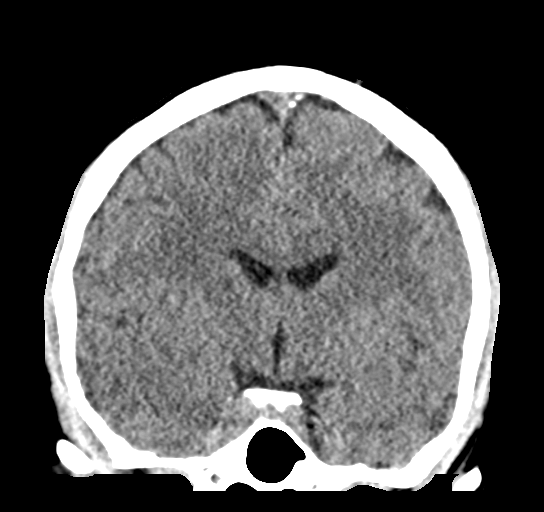
[im 38/69  brain]
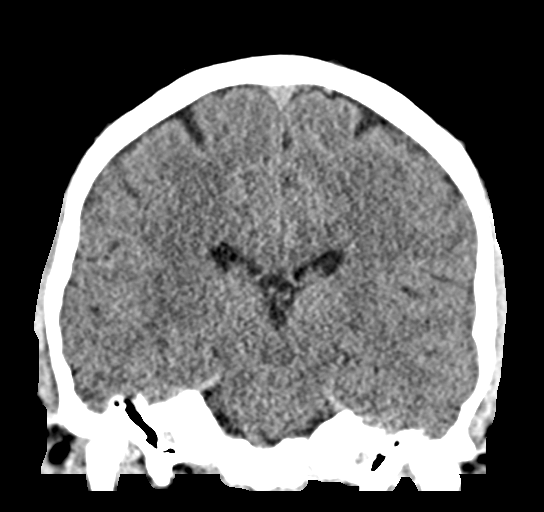

[Series 5: sag soft · sagittal · 0.31mm/px · 3 of 54 slices shown]
[im 18/54  brain]
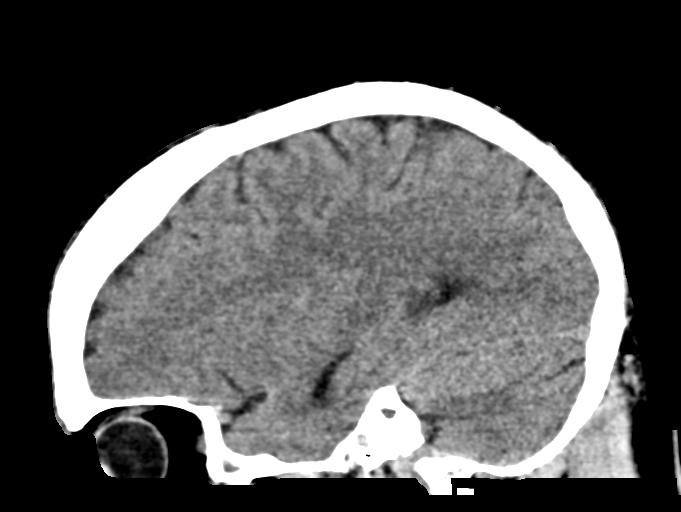
[im 27/54  brain]
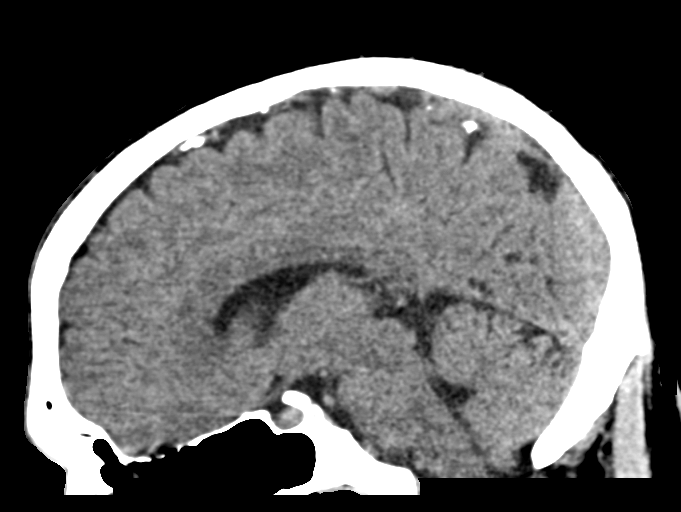
[im 36/54  brain]
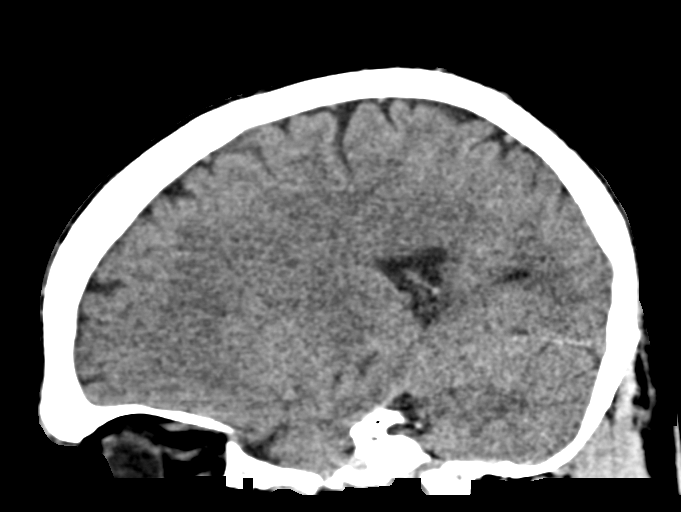

[15 of 47 positions shown; findings below may reference images not displayed]

FINDINGS: Brain: The ventricles and sulci are normal in size and
configuration. There is no intracranial mass, hemorrhage,
extra-axial fluid collection, or midline shift. Brain parenchyma
appears unremarkable. No evident acute infarct.

Vascular: No hyperdense vessel.  No evident vascular calcification.

Skull: No hyperdense vessel. No arterial vascular calcification
evident.

Sinuses/Orbits: Visualized paranasal sinuses are clear. Visualized
orbits appear symmetric bilaterally.

Other: Mastoid air cells are clear.
IMPRESSION: Study within normal limits.

## 2022-06-12 ENCOUNTER — Other Ambulatory Visit (HOSPITAL_COMMUNITY)
Admission: RE | Admit: 2022-06-12 | Discharge: 2022-06-12 | Disposition: A | Discharge status: Home / Self Care | Payer: BC Managed Care – PPO | Source: Ambulatory Visit | Attending: Infectious Disease | Admitting: Infectious Disease

## 2022-06-12 ENCOUNTER — Ambulatory Visit (INDEPENDENT_AMBULATORY_CARE_PROVIDER_SITE_OTHER): Payer: BC Managed Care – PPO | Admitting: Pharmacist

## 2022-06-12 ENCOUNTER — Other Ambulatory Visit: Payer: Self-pay

## 2022-06-12 DIAGNOSIS — Z113 Encounter for screening for infections with a predominantly sexual mode of transmission: Secondary | ICD-10-CM | POA: Diagnosis not present

## 2022-06-12 DIAGNOSIS — Z79899 Other long term (current) drug therapy: Secondary | ICD-10-CM | POA: Diagnosis not present

## 2022-06-12 DIAGNOSIS — Z2981 Encounter for HIV pre-exposure prophylaxis: Secondary | ICD-10-CM

## 2022-06-12 MED ORDER — CABOTEGRAVIR ER 600 MG/3ML IM SUER
600.0000 mg | Freq: Once | INTRAMUSCULAR | Status: AC
  Administered 2022-06-12: 600 mg via INTRAMUSCULAR

## 2022-06-12 NOTE — Progress Notes (Signed)
HPI: Apollo Timothy is a 41 y.o. male who presents to the [facility] pharmacy clinic for Apretude administration and HIV PrEP follow up.  Patient Active Problem List   Diagnosis Date Noted   Male erectile disorder (CODE) 05/27/2021   Male infertility 05/27/2021   Tension type headache 05/27/2021   Chronic pansinusitis 12/03/2017   Deviated septum 12/03/2017   Hypertrophy, nasal, turbinate 12/03/2017    Patient's Medications  New Prescriptions   No medications on file  Previous Medications   CABOTEGRAVIR ER (APRETUDE) 600 MG/3ML INJECTION    Inject 3 mLs (600 mg total) into the muscle every 2 (two) months.   DICLOFENAC (VOLTAREN) 75 MG EC TABLET    Take 1 tablet (75 mg total) by mouth 2 (two) times daily.   DOXYCYCLINE (VIBRA-TABS) 100 MG TABLET    Take 1 tablet (100 mg total) by mouth 2 (two) times daily.   SILDENAFIL (REVATIO) 20 MG TABLET    See admin instructions.   TERBINAFINE (LAMISIL) 250 MG TABLET    Please take one a day x 7days, repeat every 4 weeks x 4 months  Modified Medications   No medications on file  Discontinued Medications   No medications on file    Allergies: Allergies  Allergen Reactions   Doxycycline Other (See Comments)    Fixed Drug Reaction     Past Medical History: No past medical history on file.  Social History: Social History   Socioeconomic History   Marital status: Married    Spouse name: Not on file   Number of children: Not on file   Years of education: Not on file   Highest education level: Not on file  Occupational History   Not on file  Tobacco Use   Smoking status: Never   Smokeless tobacco: Never  Vaping Use   Vaping Use: Never used  Substance and Sexual Activity   Alcohol use: No   Drug use: No   Sexual activity: Not on file  Other Topics Concern   Not on file  Social History Narrative   Not on file   Social Determinants of Health   Financial Resource Strain: Not on file  Food Insecurity: Not on file  Transportation  Needs: Not on file  Physical Activity: Not on file  Stress: Not on file  Social Connections: Not on file    Labs: Lab Results  Component Value Date   HIV1RNAQUANT Not Detected 04/17/2022   HIV1RNAQUANT Not Detected 02/14/2022   HIV1RNAQUANT Not Detected 12/14/2021    RPR and STI Lab Results  Component Value Date   LABRPR NON-REACTIVE 10/18/2021   LABRPR NON-REACTIVE 06/13/2021   LABRPR NON-REACTIVE 02/16/2021   LABRPR NON-REACTIVE 12/09/2020   LABRPR NON-REACTIVE 07/01/2020    STI Results GC GC CT CT  10/19/2021 11:15 AM Negative   Negative    06/13/2021 11:22 AM Negative   Negative    06/13/2021 11:12 AM Negative    Negative   Negative    Negative    02/16/2021 10:59 AM Negative   Negative    10/12/2016  9:35 AM  NOT DETECTED   NOT DETECTED     Hepatitis B Lab Results  Component Value Date   HEPBSAB POS (A) 10/12/2016   HEPBSAG NEGATIVE 10/04/2016   HEPBCAB NON REACTIVE 10/12/2016   Hepatitis C Lab Results  Component Value Date   HEPCAB NON-REACTIVE 12/09/2020   Hepatitis A No results found for: "HAV" Lipids: Lab Results  Component Value Date   CHOL 140  10/16/2018   TRIG 76 10/16/2018   HDL 43 10/16/2018   CHOLHDL 3.3 10/16/2018   VLDL 13 10/12/2016   LDLCALC 81 10/16/2018    TARGET DATE: The 24th  Assessment: Fernando presents today for his Apretude injection and to follow up for HIV PrEP. No issues with past injections. Screened for acute HIV symptoms such as fatigue, muscle aches, rash, sore throat, lymphadenopathy, headache, night sweats, nausea/vomiting/diarrhea, and fever. Denies any symptoms. No known exposures to any STIs and no signs or symptoms of any STIs today.   Per [employer] guidelines, a rapid HIV test should be drawn prior to Apretude administration. Due to state shortage of rapid HIV tests, this is temporarily unable to be done. Per decision from [facility] physicians, we will proceed with Apretude administration at this time without a  negative rapid HIV test beforehand. HIV RNA was collected today and is in process.  Administered cabotegravir 600mg /91mL in right upper outer quadrant of the gluteal muscle. Injection was tolerated well without issue. Will see him back in 2 months for injection, labs, and HIV PrEP follow up.  Plan: - Apretude injection administered - HIV RNA, RPR, and urine cytology today - Next injection, labs, and PrEP follow up appointment scheduled for 08/15/22 with me - Call with any issues or questions  Aanika Defoor L. Traevion Poehler, PharmD, BCIDP, AAHIVP, CPP Clinical Pharmacist Practitioner Infectious Diseases Clinical Pharmacist [facility]

## 2022-06-13 LAB — URINE CYTOLOGY ANCILLARY ONLY
Chlamydia: NEGATIVE
Comment: NEGATIVE
Comment: NORMAL
Neisseria Gonorrhea: NEGATIVE

## 2022-06-14 LAB — RPR: RPR Ser Ql: NONREACTIVE

## 2022-06-14 LAB — HIV-1 RNA QUANT-NO REFLEX-BLD
HIV 1 RNA Quant: NOT DETECTED Copies/mL
HIV-1 RNA Quant, Log: NOT DETECTED Log cps/mL

## 2022-06-15 DIAGNOSIS — Z13220 Encounter for screening for lipoid disorders: Secondary | ICD-10-CM | POA: Diagnosis not present

## 2022-06-15 DIAGNOSIS — Z0000 Encounter for general adult medical examination without abnormal findings: Secondary | ICD-10-CM | POA: Diagnosis not present

## 2022-06-16 LAB — LAB REPORT - SCANNED: EGFR: 91

## 2022-08-15 ENCOUNTER — Ambulatory Visit (INDEPENDENT_AMBULATORY_CARE_PROVIDER_SITE_OTHER): Payer: BC Managed Care – PPO | Admitting: Pharmacist

## 2022-08-15 ENCOUNTER — Other Ambulatory Visit: Payer: Self-pay

## 2022-08-15 DIAGNOSIS — Z79899 Other long term (current) drug therapy: Secondary | ICD-10-CM

## 2022-08-15 DIAGNOSIS — Z23 Encounter for immunization: Secondary | ICD-10-CM | POA: Diagnosis not present

## 2022-08-15 DIAGNOSIS — Z2981 Encounter for HIV pre-exposure prophylaxis: Secondary | ICD-10-CM | POA: Diagnosis not present

## 2022-08-15 MED ORDER — CABOTEGRAVIR ER 600 MG/3ML IM SUER
600.0000 mg | Freq: Once | INTRAMUSCULAR | Status: AC
  Administered 2022-08-15: 600 mg via INTRAMUSCULAR

## 2022-08-15 NOTE — Progress Notes (Addendum)
HPI: Michael Duncan is a 42 y.o. male who presents to the [place] clinic for Apretude administration and HIV PrEP follow up.  Patient Active Problem List   Diagnosis Date Noted   Male erectile disorder (CODE) 05/27/2021   Male infertility 05/27/2021   Tension type headache 05/27/2021   Chronic pansinusitis 12/03/2017   Deviated septum 12/03/2017   Hypertrophy, nasal, turbinate 12/03/2017    Patient's Medications  New Prescriptions   No medications on file  Previous Medications   CABOTEGRAVIR ER (APRETUDE) 600 MG/3ML INJECTION    Inject 3 mLs (600 mg total) into the muscle every 2 (two) months.   DICLOFENAC (VOLTAREN) 75 MG EC TABLET    Take 1 tablet (75 mg total) by mouth 2 (two) times daily.   DOXYCYCLINE (VIBRA-TABS) 100 MG TABLET    Take 1 tablet (100 mg total) by mouth 2 (two) times daily.   SILDENAFIL (REVATIO) 20 MG TABLET    See admin instructions.   TERBINAFINE (LAMISIL) 250 MG TABLET    Please take one a day x 7days, repeat every 4 weeks x 4 months  Modified Medications   No medications on file  Discontinued Medications   No medications on file    Allergies: Allergies  Allergen Reactions   Doxycycline Other (See Comments)    Fixed Drug Reaction     Past Medical History: No past medical history on file.  Social History: Social History   Socioeconomic History   Marital status: Married    Spouse name: Not on file   Number of children: Not on file   Years of education: Not on file   Highest education level: Not on file  Occupational History   Not on file  Tobacco Use   Smoking status: Never   Smokeless tobacco: Never  Vaping Use   Vaping Use: Never used  Substance and Sexual Activity   Alcohol use: No   Drug use: No   Sexual activity: Not on file  Other Topics Concern   Not on file  Social History Narrative   Not on file   Social Determinants of Health   Financial Resource Strain: Not on file  Food Insecurity: Not on file  Transportation  Needs: Not on file  Physical Activity: Not on file  Stress: Not on file  Social Connections: Not on file    Labs: Lab Results  Component Value Date   HIV1RNAQUANT Not Detected 06/12/2022   HIV1RNAQUANT Not Detected 04/17/2022   HIV1RNAQUANT Not Detected 02/14/2022    RPR and STI Lab Results  Component Value Date   LABRPR NON-REACTIVE 06/12/2022   LABRPR NON-REACTIVE 10/18/2021   LABRPR NON-REACTIVE 06/13/2021   LABRPR NON-REACTIVE 02/16/2021   LABRPR NON-REACTIVE 12/09/2020    STI Results GC GC CT CT  06/12/2022  2:39 PM Negative   Negative    10/19/2021 11:15 AM Negative   Negative    06/13/2021 11:22 AM Negative   Negative    06/13/2021 11:12 AM Negative    Negative   Negative    Negative    02/16/2021 10:59 AM Negative   Negative    10/12/2016  9:35 AM  NOT DETECTED   NOT DETECTED     Hepatitis B Lab Results  Component Value Date   HEPBSAB POS (A) 10/12/2016   HEPBSAG NEGATIVE 10/04/2016   HEPBCAB NON REACTIVE 10/12/2016   Hepatitis C Lab Results  Component Value Date   HEPCAB NON-REACTIVE 12/09/2020   Hepatitis A No results found for: "HAV"  Lipids: Lab Results  Component Value Date   CHOL 140 10/16/2018   TRIG 76 10/16/2018   HDL 43 10/16/2018   CHOLHDL 3.3 10/16/2018   VLDL 13 10/12/2016   LDLCALC 81 10/16/2018    TARGET DATE: 24th  Assessment: Pilot presents today for maintenance Apretude injection and to follow up for HIV PrEP. No issues with past injections. Screened for acute HIV symptoms such as fatigue, muscle aches, rash, sore throat, lymphadenopathy, headache, night sweats, nausea/vomiting/diarrhea, and fever. Denies any symptoms. No known exposures to any STIs since last visit. Politely declines STI testing today, prefers testing every other visit moving forward.   He is eligible for the mpox vaccine and agrees to receive the first dose today. He does not have an HAV antibody on file, but would prefer to receive this with his PCP at  his next appointment.   Per [employer] guidelines, a rapid HIV test should be drawn prior to Apretude administration. Due to state shortage of rapid HIV tests, this is temporarily unable to be done. Per decision from [place], we will proceed with Apretude administration at this time without a negative rapid HIV test beforehand. HIV RNA was collected today and is in process.  Administered cabotegravir 645m/3mL in right upper outer quadrant of the gluteal muscle. Will see him back in 2 months for injection, labs, and HIV PrEP follow up.  Plan: - Apretude injection administered - 1st mpox vaccine administered - HIV RNA today - Follow up appointment scheduled 09/14/22 for 2nd mpox vaccine and 10/11/2022 for next injection, labs, and PrEP - Call with any issues or questions  [name] PharmD PGY1 Pharmacy Resident 08/15/2022 3:03 PM

## 2022-08-17 LAB — HIV-1 RNA QUANT-NO REFLEX-BLD
HIV 1 RNA Quant: NOT DETECTED Copies/mL
HIV-1 RNA Quant, Log: NOT DETECTED Log cps/mL

## 2022-09-07 ENCOUNTER — Ambulatory Visit: Payer: BC Managed Care – PPO | Admitting: Dermatology

## 2022-09-14 ENCOUNTER — Ambulatory Visit (INDEPENDENT_AMBULATORY_CARE_PROVIDER_SITE_OTHER): Payer: BC Managed Care – PPO | Admitting: Pharmacist

## 2022-09-14 ENCOUNTER — Other Ambulatory Visit: Payer: Self-pay

## 2022-09-14 DIAGNOSIS — Z23 Encounter for immunization: Secondary | ICD-10-CM

## 2022-09-14 NOTE — Progress Notes (Signed)
   [facility] Pharmacy Vaccination Visit  HPI: Michael Duncan is a 42 y.o. male who presents to the [facility] clinic for his second monkeypox vaccination.  Hepatitis B Lab Results  Component Value Date   HEPBSAB POS (A) 10/12/2016   Lab Results  Component Value Date   HEPBSAG NEGATIVE 10/04/2016    Hepatitis C Lab Results  Component Value Date   HCVAB NEGATIVE 10/04/2016    Assessment & Plan: - Patient reports receiving both the flu and updated COVID vaccines a few months ago from his PCP   - Administered 2nd dose of monkeypox vaccine - Patient tolerated well - Next Apretude injection scheduled for 10/11/22 with [place], PharmD PGY1 Pharmacy Resident 3/21/20242:44 PM

## 2022-09-19 ENCOUNTER — Ambulatory Visit (INDEPENDENT_AMBULATORY_CARE_PROVIDER_SITE_OTHER): Payer: BC Managed Care – PPO | Admitting: Dermatology

## 2022-09-19 ENCOUNTER — Encounter: Payer: Self-pay | Admitting: Dermatology

## 2022-09-19 DIAGNOSIS — D1721 Benign lipomatous neoplasm of skin and subcutaneous tissue of right arm: Secondary | ICD-10-CM | POA: Diagnosis not present

## 2022-09-19 NOTE — Progress Notes (Signed)
   New Patient Visit  Subjective  Michael Duncan is a 42 y.o. male who presents for the following: Lipoma (Patient is here for multiple lipomas in arms and legs. Have had them for several years. Growing in size and multiplying. Has not had them looked at them before. Denies pain ).    Objective  Well appearing patient in no apparent distress; mood and affect are within normal limits.  A focused examination was performed including arms and legs. Relevant physical exam findings are noted in the Assessment and Plan.  Right and left arms, right thigh Numerous soft rubbery subcutaneous nodules in the forearm measuring 1-2cm              Assessment & Plan  Lipoma of right upper extremity Right and left arms, right thigh  Benign-appearing. Exam most consistent with a lipoma. Discussed that a lipoma is a benign fatty growth that can grow over time and sometimes get irritated. Recommend observation if it is not bothersome or changing. Discussed option of surgical excision to remove it if it is growing, symptomatic, or other changes noted. Please call for new or changing lesions so they can be evaluated.  Pt would like surgical removal of lesions  Referral made to plastic surgery    Return if symptoms worsen or fail to improve.  I, Zigmund Gottron, CMA, am acting as scribe for Ellard Artis, MD.  Documentation: I have reviewed the above documentation for accuracy and completeness, and I agree with the above  [place], DO

## 2022-09-19 NOTE — Patient Instructions (Signed)
Due to recent changes in healthcare laws, you may see results of your pathology and/or laboratory studies on MyChart before the doctors have had a chance to review them. We understand that in some cases there may be results that are confusing or concerning to you. Please understand that not all results are received at the same time and often the doctors may need to interpret multiple results in order to provide you with the best plan of care or course of treatment. Therefore, we ask that you please give [place] 2 business days to thoroughly review all your results before contacting the office for clarification. Should we see a critical lab result, you will be contacted sooner.   If You Need Anything After Your Visit  If you have any questions or concerns for your doctor, please call our main line at [phone] If no one answers, please leave a voicemail as directed and we will return your call as soon as possible. Messages left after 4 pm will be answered the following business day.   You may also send [place] a message via [place]. We typically respond to MyChart messages within 1-2 business days.  For prescription refills, please ask your pharmacy to contact our office. Our fax number is [phone].  If you have an urgent issue when the clinic is closed that cannot wait until the next business day, you can page your doctor at the number below.    Please note that while we do our best to be available for urgent issues outside of office hours, we are not available 24/7.   If you have an urgent issue and are unable to reach [place], you may choose to seek medical care at your doctor's office, retail clinic, urgent care center, or emergency room.  If you have a medical emergency, please immediately call 911 or go to the emergency department. In the event of inclement weather, please call our main line at [phone] for an update on the status of any delays or closures.  Dermatology Medication Tips: Please  keep the boxes that topical medications come in in order to help keep track of the instructions about where and how to use these. Pharmacies typically print the medication instructions only on the boxes and not directly on the medication tubes.   If your medication is too expensive, please contact our office at [phone] or send [place] a message through [place].   We are unable to tell what your co-pay for medications will be in advance as this is different depending on your insurance coverage. However, we may be able to find a substitute medication at lower cost or fill out paperwork to get insurance to cover a needed medication.   If a prior authorization is required to get your medication covered by your insurance company, please allow [place] 1-2 business days to complete this process.  Drug prices often vary depending on where the prescription is filled and some pharmacies may offer cheaper prices.  The website www.goodrx.com contains coupons for medications through different pharmacies. The prices here do not account for what the cost may be with help from insurance (it may be cheaper with your insurance), but the website can give you the price if you did not use any insurance.  - You can print the associated coupon and take it with your prescription to the pharmacy.  - You may also stop by our office during regular business hours and pick up a GoodRx coupon card.  - If you need your  prescription sent electronically to a different pharmacy, notify our office through [facility] or by phone at [phone]

## 2022-10-11 ENCOUNTER — Other Ambulatory Visit (HOSPITAL_COMMUNITY)
Admission: RE | Admit: 2022-10-11 | Discharge: 2022-10-11 | Disposition: A | Discharge status: Home / Self Care | Payer: BC Managed Care – PPO | Source: Ambulatory Visit | Attending: Infectious Disease | Admitting: Infectious Disease

## 2022-10-11 ENCOUNTER — Ambulatory Visit (INDEPENDENT_AMBULATORY_CARE_PROVIDER_SITE_OTHER): Payer: BC Managed Care – PPO | Admitting: Pharmacist

## 2022-10-11 ENCOUNTER — Other Ambulatory Visit: Payer: Self-pay

## 2022-10-11 DIAGNOSIS — Z113 Encounter for screening for infections with a predominantly sexual mode of transmission: Secondary | ICD-10-CM | POA: Diagnosis not present

## 2022-10-11 DIAGNOSIS — Z23 Encounter for immunization: Secondary | ICD-10-CM

## 2022-10-11 DIAGNOSIS — Z79899 Other long term (current) drug therapy: Secondary | ICD-10-CM

## 2022-10-11 DIAGNOSIS — Z2981 Encounter for HIV pre-exposure prophylaxis: Secondary | ICD-10-CM

## 2022-10-11 MED ORDER — CABOTEGRAVIR ER 600 MG/3ML IM SUER
600.0000 mg | Freq: Once | INTRAMUSCULAR | Status: AC
Start: 2022-10-11 — End: 2022-10-11
  Administered 2022-10-11: 600 mg via INTRAMUSCULAR

## 2022-10-11 NOTE — Progress Notes (Signed)
HPI: Michael Duncan is a 42 y.o. male who presents to the [facility] pharmacy clinic for Apretude administration and HIV PrEP follow up.  Insured      Uninsured     Patient Active Problem List   Diagnosis Date Noted   Male erectile disorder (CODE) 05/27/2021   Male infertility 05/27/2021   Tension type headache 05/27/2021   Chronic pansinusitis 12/03/2017   Deviated septum 12/03/2017   Hypertrophy, nasal, turbinate 12/03/2017    Patient's Medications  New Prescriptions   No medications on file  Previous Medications   CABOTEGRAVIR ER (APRETUDE) 600 MG/3ML INJECTION    Inject 3 mLs (600 mg total) into the muscle every 2 (two) months.   DICLOFENAC (VOLTAREN) 75 MG EC TABLET    Take 1 tablet (75 mg total) by mouth 2 (two) times daily.   DOXYCYCLINE (VIBRA-TABS) 100 MG TABLET    Take 1 tablet (100 mg total) by mouth 2 (two) times daily.   SILDENAFIL (REVATIO) 20 MG TABLET    See admin instructions.   TERBINAFINE (LAMISIL) 250 MG TABLET    Please take one a day x 7days, repeat every 4 weeks x 4 months  Modified Medications   No medications on file  Discontinued Medications   No medications on file    Allergies: Allergies  Allergen Reactions   Doxycycline Other (See Comments)    Fixed Drug Reaction     Past Medical History: No past medical history on file.  Social History: Social History   Socioeconomic History   Marital status: Married    Spouse name: Not on file   Number of children: Not on file   Years of education: Not on file   Highest education level: Not on file  Occupational History   Not on file  Tobacco Use   Smoking status: Never   Smokeless tobacco: Never  Vaping Use   Vaping Use: Never used  Substance and Sexual Activity   Alcohol use: No   Drug use: No   Sexual activity: Not on file  Other Topics Concern   Not on file  Social History Narrative   Not on file   Social Determinants of Health   Financial Resource Strain: Not on file  Food  Insecurity: Not on file  Transportation Needs: Not on file  Physical Activity: Not on file  Stress: Not on file  Social Connections: Not on file    Labs: Lab Results  Component Value Date   HIV1RNAQUANT Not Detected 08/15/2022   HIV1RNAQUANT Not Detected 06/12/2022   HIV1RNAQUANT Not Detected 04/17/2022    RPR and STI Lab Results  Component Value Date   LABRPR NON-REACTIVE 06/12/2022   LABRPR NON-REACTIVE 10/18/2021   LABRPR NON-REACTIVE 06/13/2021   LABRPR NON-REACTIVE 02/16/2021   LABRPR NON-REACTIVE 12/09/2020    STI Results GC GC CT CT  06/12/2022  2:39 PM Negative   Negative    10/19/2021 11:15 AM Negative   Negative    06/13/2021 11:22 AM Negative   Negative    06/13/2021 11:12 AM Negative    Negative   Negative    Negative    02/16/2021 10:59 AM Negative   Negative    10/12/2016  9:35 AM  NOT DETECTED   NOT DETECTED     Hepatitis B Lab Results  Component Value Date   HEPBSAB POS (A) 10/12/2016   HEPBSAG NEGATIVE 10/04/2016   HEPBCAB NON REACTIVE 10/12/2016   Hepatitis C Lab Results  Component Value Date   HEPCAB  NON-REACTIVE 12/09/2020   Hepatitis A No results found for: "HAV" Lipids: Lab Results  Component Value Date   CHOL 140 10/16/2018   TRIG 76 10/16/2018   HDL 43 10/16/2018   CHOLHDL 3.3 10/16/2018   VLDL 13 10/12/2016   LDLCALC 81 10/16/2018    TARGET DATE: The 24th   Assessment: Michael Duncan presents today for his Apretude injection and to follow up for HIV PrEP. No issues with past injections. Screened for acute HIV symptoms such as fatigue, muscle aches, rash, sore throat, lymphadenopathy, headache, night sweats, nausea/vomiting/diarrhea, and fever. Denies any symptoms.   Per [employer] guidelines, a rapid HIV test should be drawn prior to Apretude administration. Due to state shortage of rapid HIV tests, this is temporarily unable to be done. Per decision from [facility] physicians, we will proceed with Apretude administration at this time  without a negative rapid HIV test beforehand. HIV RNA was collected today and is in process.  Administered cabotegravir /31mL in right upper outer quadrant of the gluteal muscle. Will see him back in 2 months for injection, labs, and HIV PrEP follow up.  No known exposures to any STIs since last visit. He agrees to full STI testing today with RPR and oral/urine/rectal cytologies.   Michael Duncan tells me that he received a COVID vaccine in October, but cannot remember if it was at a pharmacy or his PCP office. I have updated this in our records. We do not have record of a hepatitis A vaccine and also do not have a hepatitis A antibody measurement. Will get one today. He is otherwise up to date on vaccines.   Plan: - Apretude injection administered - Follow up HIV RNA, full cytologies including rectal, oral, and urine plus RPR, and hepatitis A antibody from today  - Next injection, labs, and PrEP follow up appointment scheduled for June 19th, 2024 with [name]  - Call with any issues or questions  Blane Ohara, PharmD  PGY1 Pharmacy Resident

## 2022-10-12 LAB — URINE CYTOLOGY ANCILLARY ONLY
Chlamydia: POSITIVE — AB
Comment: NEGATIVE
Comment: NORMAL
Neisseria Gonorrhea: NEGATIVE

## 2022-10-12 LAB — CYTOLOGY, (ORAL, ANAL, URETHRAL) ANCILLARY ONLY
Chlamydia: NEGATIVE
Chlamydia: NEGATIVE
Comment: NEGATIVE
Comment: NEGATIVE
Comment: NORMAL
Comment: NORMAL
Neisseria Gonorrhea: NEGATIVE
Neisseria Gonorrhea: NEGATIVE

## 2022-10-13 ENCOUNTER — Other Ambulatory Visit: Payer: Self-pay | Admitting: Pharmacist

## 2022-10-13 ENCOUNTER — Telehealth: Payer: Self-pay

## 2022-10-13 DIAGNOSIS — A749 Chlamydial infection, unspecified: Secondary | ICD-10-CM

## 2022-10-13 LAB — HIV-1 RNA QUANT-NO REFLEX-BLD
HIV 1 RNA Quant: NOT DETECTED Copies/mL
HIV-1 RNA Quant, Log: NOT DETECTED Log cps/mL

## 2022-10-13 LAB — RPR: RPR Ser Ql: NONREACTIVE

## 2022-10-13 LAB — HEPATITIS A ANTIBODY, TOTAL: Hepatitis A AB,Total: REACTIVE — AB

## 2022-10-13 MED ORDER — AZITHROMYCIN 250 MG PO TABS
1000.0000 mg | ORAL_TABLET | Freq: Once | ORAL | Status: DC
Start: 2022-10-13 — End: 2022-10-13

## 2022-10-13 MED ORDER — AZITHROMYCIN 500 MG PO TABS
1000.0000 mg | ORAL_TABLET | Freq: Once | ORAL | 0 refills | Status: AC
Start: 2022-10-13 — End: 2022-10-13

## 2022-10-13 NOTE — Telephone Encounter (Signed)
I spoke to Michael Duncan and let him know that his urine was positive for chlamydia. He reports that he had a reaction to doxycycline where the skin on the head of his penis peeled off. Called in azithromycin 1g x 1 to his pharmacy of choice. Counseled him to abstain from sex for at least 7 days after treatment. All questions answered.   Blane Ohara, PharmD  PGY1 Pharmacy Resident

## 2022-10-26 ENCOUNTER — Telehealth: Payer: Self-pay

## 2022-10-26 NOTE — Telephone Encounter (Addendum)
Pharmacy Patient Advocate Encounter- Apretude BIV-Medical Benefit:  J code: [id number] CPT code: 62130  Dx Code: Z72.53  ANTHEM [place]      Clearance Coots, CPhT Specialty Pharmacy Patient [facility] Phone: [phone] Fax:  [phone]

## 2022-11-22 ENCOUNTER — Institutional Professional Consult (permissible substitution): Payer: BC Managed Care – PPO | Admitting: Plastic Surgery

## 2022-12-13 ENCOUNTER — Ambulatory Visit (INDEPENDENT_AMBULATORY_CARE_PROVIDER_SITE_OTHER): Payer: BC Managed Care – PPO | Admitting: Pharmacist

## 2022-12-13 ENCOUNTER — Other Ambulatory Visit: Payer: Self-pay

## 2022-12-13 DIAGNOSIS — Z2981 Encounter for HIV pre-exposure prophylaxis: Secondary | ICD-10-CM

## 2022-12-13 DIAGNOSIS — Z79899 Other long term (current) drug therapy: Secondary | ICD-10-CM

## 2022-12-13 MED ORDER — CABOTEGRAVIR ER 600 MG/3ML IM SUER
600.0000 mg | Freq: Once | INTRAMUSCULAR | Status: AC
Start: 2022-12-13 — End: 2022-12-13
  Administered 2022-12-13: 600 mg via INTRAMUSCULAR

## 2022-12-13 NOTE — Progress Notes (Signed)
HPI: Michael Duncan is a 42 y.o. male who presents to the [facility] pharmacy clinic for Apretude administration and HIV PrEP follow up.  Insured   [x]    Uninsured  []    Patient Active Problem List   Diagnosis Date Noted   Male erectile disorder (CODE) 05/27/2021   Male infertility 05/27/2021   Tension type headache 05/27/2021   Chronic pansinusitis 12/03/2017   Deviated septum 12/03/2017   Hypertrophy, nasal, turbinate 12/03/2017    Patient's Medications  New Prescriptions   No medications on file  Previous Medications   CABOTEGRAVIR ER (APRETUDE) 600 MG/3ML INJECTION    Inject 3 mLs (600 mg total) into the muscle every 2 (two) months.   DICLOFENAC (VOLTAREN) 75 MG EC TABLET    Take 1 tablet (75 mg total) by mouth 2 (two) times daily.   DOXYCYCLINE (VIBRA-TABS) 100 MG TABLET    Take 1 tablet (100 mg total) by mouth 2 (two) times daily.   SILDENAFIL (REVATIO) 20 MG TABLET    See admin instructions.   TERBINAFINE (LAMISIL) 250 MG TABLET    Please take one a day x 7days, repeat every 4 weeks x 4 months  Modified Medications   No medications on file  Discontinued Medications   No medications on file    Allergies: Allergies  Allergen Reactions   Doxycycline Other (See Comments)    Fixed Drug Reaction     Past Medical History: No past medical history on file.  Social History: Social History   Socioeconomic History   Marital status: Married    Spouse name: Not on file   Number of children: Not on file   Years of education: Not on file   Highest education level: Not on file  Occupational History   Not on file  Tobacco Use   Smoking status: Never   Smokeless tobacco: Never  Vaping Use   Vaping Use: Never used  Substance and Sexual Activity   Alcohol use: No   Drug use: No   Sexual activity: Not on file  Other Topics Concern   Not on file  Social History Narrative   Not on file   Social Determinants of Health   Financial Resource Strain: Not on file  Food  Insecurity: Not on file  Transportation Needs: Not on file  Physical Activity: Not on file  Stress: Not on file  Social Connections: Not on file    Labs: Lab Results  Component Value Date   HIV1RNAQUANT Not Detected 10/11/2022   HIV1RNAQUANT Not Detected 08/15/2022   HIV1RNAQUANT Not Detected 06/12/2022    RPR and STI Lab Results  Component Value Date   LABRPR NON-REACTIVE 10/11/2022   LABRPR NON-REACTIVE 06/12/2022   LABRPR NON-REACTIVE 10/18/2021   LABRPR NON-REACTIVE 06/13/2021   LABRPR NON-REACTIVE 02/16/2021    STI Results GC GC CT CT  10/11/2022  2:52 PM Negative    Negative    Negative   Negative    Negative    Positive    06/12/2022  2:39 PM Negative   Negative    10/19/2021 11:15 AM Negative   Negative    06/13/2021 11:22 AM Negative   Negative    06/13/2021 11:12 AM Negative    Negative   Negative    Negative    02/16/2021 10:59 AM Negative   Negative    10/12/2016  9:35 AM  NOT DETECTED   NOT DETECTED     Hepatitis B Lab Results  Component Value Date   HEPBSAB  POS (A) 10/12/2016   HEPBSAG NEGATIVE 10/04/2016   HEPBCAB NON REACTIVE 10/12/2016   Hepatitis C Lab Results  Component Value Date   HEPCAB NON-REACTIVE 12/09/2020   Hepatitis A Lab Results  Component Value Date   HAV REACTIVE (A) 10/11/2022   Lipids: Lab Results  Component Value Date   CHOL 140 10/16/2018   TRIG 76 10/16/2018   HDL 43 10/16/2018   CHOLHDL 3.3 10/16/2018   VLDL 13 10/12/2016   LDLCALC 81 10/16/2018    TARGET DATE: The 24th  Assessment: Michael Duncan presents today for his Apretude injection and to follow up for HIV PrEP. No issues with past injections. Denies any symptoms of acute HIV. Last STI screening was on 10/11/22 and was positive for chlamydia. He received appropriate treatment. No known exposures to any STIs since last visit. Politely declines STI testing today.   Per [employer] guidelines, a rapid HIV test should be drawn prior to Apretude administration.  Due to state shortage of rapid HIV tests, this is temporarily unable to be done. Per decision from [facility] physicians, we will proceed with Apretude administration at this time without a negative rapid HIV test beforehand. HIV RNA was collected today and is in process.  Administered cabotegravir 600mg /62mL in left upper outer quadrant of the gluteal muscle. Will see him back in 2 months for injection, labs, and HIV PrEP follow up.  Plan: - Apretude injection administered - HIV RNA today - Next injection, labs, and PrEP follow up appointment scheduled for 02/14/23 - Call with any issues or questions  [name] L. Acel Natzke, PharmD, BCIDP, AAHIVP, CPP Clinical Pharmacist Practitioner Infectious Diseases Clinical Pharmacist [facility]

## 2022-12-15 LAB — HIV-1 RNA QUANT-NO REFLEX-BLD
HIV 1 RNA Quant: NOT DETECTED Copies/mL
HIV-1 RNA Quant, Log: NOT DETECTED Log cps/mL

## 2023-01-05 ENCOUNTER — Institutional Professional Consult (permissible substitution): Payer: BC Managed Care – PPO | Admitting: Plastic Surgery

## 2023-02-14 ENCOUNTER — Other Ambulatory Visit: Payer: Self-pay

## 2023-02-14 ENCOUNTER — Other Ambulatory Visit (HOSPITAL_COMMUNITY)
Admission: RE | Admit: 2023-02-14 | Discharge: 2023-02-14 | Disposition: A | Discharge status: Home / Self Care | Payer: BC Managed Care – PPO | Source: Ambulatory Visit | Attending: Infectious Disease | Admitting: Infectious Disease

## 2023-02-14 ENCOUNTER — Ambulatory Visit (INDEPENDENT_AMBULATORY_CARE_PROVIDER_SITE_OTHER): Payer: BC Managed Care – PPO | Admitting: Pharmacist

## 2023-02-14 DIAGNOSIS — Z113 Encounter for screening for infections with a predominantly sexual mode of transmission: Secondary | ICD-10-CM | POA: Insufficient documentation

## 2023-02-14 DIAGNOSIS — Z79899 Other long term (current) drug therapy: Secondary | ICD-10-CM

## 2023-02-14 DIAGNOSIS — Z2981 Encounter for HIV pre-exposure prophylaxis: Secondary | ICD-10-CM | POA: Diagnosis not present

## 2023-02-14 MED ORDER — CABOTEGRAVIR ER 600 MG/3ML IM SUER
600.0000 mg | Freq: Once | INTRAMUSCULAR | Status: AC
Start: 2023-02-14 — End: 2023-02-14
  Administered 2023-02-14: 600 mg via INTRAMUSCULAR

## 2023-02-14 NOTE — Progress Notes (Signed)
HPI: Nellie Jochum is a 42 y.o. male who presents to the [facility] pharmacy clinic for Apretude administration and HIV PrEP follow up.  Insured   [x]    Uninsured  []    Patient Active Problem List   Diagnosis Date Noted   Male erectile disorder (CODE) 05/27/2021   Male infertility 05/27/2021   Tension type headache 05/27/2021   Chronic pansinusitis 12/03/2017   Deviated septum 12/03/2017   Hypertrophy, nasal, turbinate 12/03/2017    Patient's Medications  New Prescriptions   No medications on file  Previous Medications   CABOTEGRAVIR ER (APRETUDE) 600 MG/3ML INJECTION    Inject 3 mLs (600 mg total) into the muscle every 2 (two) months.   DICLOFENAC (VOLTAREN) 75 MG EC TABLET    Take 1 tablet (75 mg total) by mouth 2 (two) times daily.   DOXYCYCLINE (VIBRA-TABS) 100 MG TABLET    Take 1 tablet (100 mg total) by mouth 2 (two) times daily.   SILDENAFIL (REVATIO) 20 MG TABLET    See admin instructions.   TERBINAFINE (LAMISIL) 250 MG TABLET    Please take one a day x 7days, repeat every 4 weeks x 4 months  Modified Medications   No medications on file  Discontinued Medications   No medications on file    Allergies: Allergies  Allergen Reactions   Doxycycline Other (See Comments)    Fixed Drug Reaction     Labs: Lab Results  Component Value Date   HIV1RNAQUANT Not Detected 12/13/2022   HIV1RNAQUANT Not Detected 10/11/2022   HIV1RNAQUANT Not Detected 08/15/2022    RPR and STI Lab Results  Component Value Date   LABRPR NON-REACTIVE 10/11/2022   LABRPR NON-REACTIVE 06/12/2022   LABRPR NON-REACTIVE 10/18/2021   LABRPR NON-REACTIVE 06/13/2021   LABRPR NON-REACTIVE 02/16/2021    STI Results GC GC CT CT  10/11/2022  2:52 PM Negative    Negative    Negative   Negative    Negative    Positive    06/12/2022  2:39 PM Negative   Negative    10/19/2021 11:15 AM Negative   Negative    06/13/2021 11:22 AM Negative   Negative    06/13/2021 11:12 AM Negative    Negative    Negative    Negative    02/16/2021 10:59 AM Negative   Negative    10/12/2016  9:35 AM  NOT DETECTED   NOT DETECTED     Hepatitis B Lab Results  Component Value Date   HEPBSAB POS (A) 10/12/2016   HEPBSAG NEGATIVE 10/04/2016   HEPBCAB NON REACTIVE 10/12/2016   Hepatitis C Lab Results  Component Value Date   HEPCAB NON-REACTIVE 12/09/2020   Hepatitis A Lab Results  Component Value Date   HAV REACTIVE (A) 10/11/2022   Lipids: Lab Results  Component Value Date   CHOL 140 10/16/2018   TRIG 76 10/16/2018   HDL 43 10/16/2018   CHOLHDL 3.3 10/16/2018   VLDL 13 10/12/2016   LDLCALC 81 10/16/2018    TARGET DATE: The 24th  Assessment: Collie presents today for his Apretude injection and to follow up for HIV PrEP. No issues with past injections. Denies any symptoms of acute HIV. Last STI screening was on 10/11/22 and was positive for urethral chlamydia. No known exposures to any STIs since last visit. Agrees to full STI testing today with RPR and oral/urine/rectal cytologies.   Per [employer] guidelines, a rapid HIV test should be drawn prior to Apretude administration. Due to state shortage  of rapid HIV tests, this is temporarily unable to be done. Per decision from [facility] physicians, we will proceed with Apretude administration at this time without a negative rapid HIV test beforehand. HIV RNA was collected today and is in process.  Administered cabotegravir 600mg /34mL in left upper outer quadrant of the gluteal muscle. Will see him back in 2 months for injection, labs, and HIV PrEP follow up.  Plan: - Apretude injection administered - HIV RNA, RPR, and urine/rectal/pharyngeal cytologies for GC/chlamydia today - Next injection, labs, and PrEP follow up appointment scheduled for 04/17/23 - Call with any issues or questions  Decorey Wahlert L. Yecheskel Kurek, PharmD, BCIDP, AAHIVP, CPP Clinical Pharmacist Practitioner Infectious Diseases Clinical Pharmacist [facility] +

## 2023-02-15 LAB — CYTOLOGY, (ORAL, ANAL, URETHRAL) ANCILLARY ONLY
Chlamydia: NEGATIVE
Chlamydia: NEGATIVE
Comment: NEGATIVE
Comment: NEGATIVE
Comment: NORMAL
Comment: NORMAL
Neisseria Gonorrhea: NEGATIVE
Neisseria Gonorrhea: NEGATIVE

## 2023-02-15 LAB — URINE CYTOLOGY ANCILLARY ONLY
Chlamydia: NEGATIVE
Comment: NEGATIVE
Comment: NORMAL
Neisseria Gonorrhea: NEGATIVE

## 2023-02-16 LAB — RPR: RPR Ser Ql: NONREACTIVE

## 2023-02-16 LAB — HIV-1 RNA QUANT-NO REFLEX-BLD
HIV 1 RNA Quant: NOT DETECTED {copies}/mL
HIV-1 RNA Quant, Log: NOT DETECTED {Log_copies}/mL

## 2023-03-02 ENCOUNTER — Ambulatory Visit (INDEPENDENT_AMBULATORY_CARE_PROVIDER_SITE_OTHER): Payer: BC Managed Care – PPO | Admitting: Plastic Surgery

## 2023-03-02 ENCOUNTER — Encounter: Payer: Self-pay | Admitting: Plastic Surgery

## 2023-03-02 VITALS — BP 130/78 | HR 71 | Ht 73.0 in | Wt 247.0 lb

## 2023-03-02 DIAGNOSIS — R2241 Localized swelling, mass and lump, right lower limb: Secondary | ICD-10-CM | POA: Diagnosis not present

## 2023-03-02 DIAGNOSIS — R2233 Localized swelling, mass and lump, upper limb, bilateral: Secondary | ICD-10-CM | POA: Diagnosis not present

## 2023-03-02 NOTE — Progress Notes (Signed)
     Patient ID: Michael Duncan, male    DOB: 1981-01-21, 42 y.o.   MRN: [id number]   Chief Complaint  Patient presents with   Consult         The patient is a 42 year old male here for evaluation of several masses.  The patient states that he has noticed these for many years.  They seem to be getting larger and some of them are getting tender.  He has 3 on his right arm that are between 1 and 2 cm.  He has 2 on his left arm that are around 2 cm and he has 1 on his right anterior thigh.  That 1 is about 2 cm as well in size.  The mass on his left upper arm is tender.  The mass on the right upper arm and left upper arm are deeper so these will plan to do in the OR.     Review of Systems  Constitutional: Negative.   Eyes: Negative.   Respiratory: Negative.    Cardiovascular: Negative.   Gastrointestinal: Negative.   Genitourinary: Negative.     No past medical history on file.  No past surgical history on file.    Current Outpatient Medications:    cabotegravir ER (APRETUDE) 600 MG/3ML injection, Inject 3 mLs (600 mg total) into the muscle every 2 (two) months., Disp: 3 mL, Rfl: 5   diclofenac (VOLTAREN) 75 MG EC tablet, Take 1 tablet (75 mg total) by mouth 2 (two) times daily. (Patient not taking: Reported on 09/19/2022), Disp: 50 tablet, Rfl: 2   doxycycline (VIBRA-TABS) 100 MG tablet, Take 1 tablet (100 mg total) by mouth 2 (two) times daily., Disp: 14 tablet, Rfl: 0   sildenafil (REVATIO) 20 MG tablet, See admin instructions. (Patient not taking: Reported on 09/19/2022), Disp: , Rfl:    terbinafine (LAMISIL) 250 MG tablet, Please take one a day x 7days, repeat every 4 weeks x 4 months, Disp: 28 tablet, Rfl: 0   Objective:   Vitals:   03/02/23 1302  BP: 130/78  Pulse: 71  SpO2: 97%    Physical Exam Vitals reviewed.  Constitutional:      Appearance: Normal appearance.  Pulmonary:     Effort: Pulmonary effort is normal.  Musculoskeletal:        General: Tenderness  present. No swelling.  Skin:    General: Skin is warm.     Capillary Refill: Capillary refill takes less than 2 seconds.     Coloration: Skin is not jaundiced.     Findings: Lesion present. No bruising.  Neurological:     Mental Status: He is alert and oriented to person, place, and time.     Assessment & Plan:  Mass of arm, bilateral  Mass of leg, right  We will plan to remove 2 masses on the right arm 1 mass on the left arm and the mass on the right leg in the office the others we will plan to remove in the OR the reason as they are much deeper.  Alena Bills Alven Alverio, DO

## 2023-04-16 NOTE — Progress Notes (Unsigned)
HPI: Michael Duncan is a 42 y.o. male who presents to the [facility] clinic for Apretude administration and HIV PrEP follow up.  Insured   [x]    Uninsured  []    Patient Active Problem List   Diagnosis Date Noted   Mass of arm, bilateral 03/02/2023   Mass of leg, right 03/02/2023   Male erectile disorder (CODE) 05/27/2021   Male infertility 05/27/2021   Tension type headache 05/27/2021   Chronic pansinusitis 12/03/2017   Deviated septum 12/03/2017   Hypertrophy, nasal, turbinate 12/03/2017    Patient's Medications  New Prescriptions   No medications on file  Previous Medications   CABOTEGRAVIR ER (APRETUDE) 600 MG/3ML INJECTION    Inject 3 mLs (600 mg total) into the muscle every 2 (two) months.   DICLOFENAC (VOLTAREN) 75 MG EC TABLET    Take 1 tablet (75 mg total) by mouth 2 (two) times daily.   DOXYCYCLINE (VIBRA-TABS) 100 MG TABLET    Take 1 tablet (100 mg total) by mouth 2 (two) times daily.   SILDENAFIL (REVATIO) 20 MG TABLET    See admin instructions.   TERBINAFINE (LAMISIL) 250 MG TABLET    Please take one a day x 7days, repeat every 4 weeks x 4 months  Modified Medications   No medications on file  Discontinued Medications   No medications on file    Allergies: Allergies  Allergen Reactions   Doxycycline Other (See Comments)    Fixed Drug Reaction     Labs: Lab Results  Component Value Date   HIV1RNAQUANT Not Detected 02/14/2023   HIV1RNAQUANT Not Detected 12/13/2022   HIV1RNAQUANT Not Detected 10/11/2022    RPR and STI Lab Results  Component Value Date   LABRPR NON-REACTIVE 02/14/2023   LABRPR NON-REACTIVE 10/11/2022   LABRPR NON-REACTIVE 06/12/2022   LABRPR NON-REACTIVE 10/18/2021   LABRPR NON-REACTIVE 06/13/2021    STI Results GC GC CT CT  02/14/2023  1:59 PM Negative    Negative    Negative   Negative    Negative    Negative    10/11/2022  2:52 PM Negative    Negative    Negative   Negative    Negative    Positive    06/12/2022  2:39  PM Negative   Negative    10/19/2021 11:15 AM Negative   Negative    06/13/2021 11:22 AM Negative   Negative    06/13/2021 11:12 AM Negative    Negative   Negative    Negative    02/16/2021 10:59 AM Negative   Negative    10/12/2016  9:35 AM  NOT DETECTED   NOT DETECTED     Hepatitis B Lab Results  Component Value Date   HEPBSAB POS (A) 10/12/2016   HEPBSAG NEGATIVE 10/04/2016   HEPBCAB NON REACTIVE 10/12/2016   Hepatitis C Lab Results  Component Value Date   HEPCAB NON-REACTIVE 12/09/2020   Hepatitis A Lab Results  Component Value Date   HAV REACTIVE (A) 10/11/2022   Lipids: Lab Results  Component Value Date   CHOL 140 10/16/2018   TRIG 76 10/16/2018   HDL 43 10/16/2018   CHOLHDL 3.3 10/16/2018   VLDL 13 10/12/2016   LDLCALC 81 10/16/2018    TARGET DATE: 24th of the month  Assessment: Michael Duncan presents today for their Apretude injection and to follow up for HIV PrEP. No issues with past injections. Denies any symptoms of acute HIV. Last STI screening was on 02/14/23 and was negative. No  known exposures to any STIs since last visit. Michael Duncan agrees to full STI testing today with RPR and oral/urine/rectal cytologies. Will give COVID and influenza boosters today.  Per [employer] guidelines, a rapid HIV test should be drawn prior to Apretude administration. Due to state shortage of rapid HIV tests, this is temporarily unable to be done. Per decision from [facility] physicians, we will proceed with Apretude administration at this time without a negative rapid HIV test beforehand. HIV RNA was collected today and is in process.  Administered cabotegravir 600mg /23mL in right upper outer quadrant of the gluteal muscle. Will see Michael Duncan back in 2 months for injection, labs, and HIV PrEP follow up.  Plan: - Apretude injection administered - HIV RNA, RPR, and oral/urine/rectal cytologies today - Covid/Flu shot - Next injection, labs, and PrEP follow up appointment scheduled for 06/18/23 and  07/31/23 with Michael Duncan. - Call with any issues or questions  Lora Paula, PharmD PGY-2 Infectious Diseases Pharmacy Resident [facility]

## 2023-04-17 ENCOUNTER — Ambulatory Visit (INDEPENDENT_AMBULATORY_CARE_PROVIDER_SITE_OTHER): Payer: BC Managed Care – PPO | Admitting: Pharmacist

## 2023-04-17 ENCOUNTER — Other Ambulatory Visit: Payer: Self-pay

## 2023-04-17 ENCOUNTER — Other Ambulatory Visit (HOSPITAL_COMMUNITY)
Admission: RE | Admit: 2023-04-17 | Discharge: 2023-04-17 | Disposition: A | Discharge status: Home / Self Care | Payer: BC Managed Care – PPO | Source: Ambulatory Visit | Attending: Infectious Disease | Admitting: Infectious Disease

## 2023-04-17 DIAGNOSIS — Z23 Encounter for immunization: Secondary | ICD-10-CM | POA: Diagnosis not present

## 2023-04-17 DIAGNOSIS — Z2981 Encounter for HIV pre-exposure prophylaxis: Secondary | ICD-10-CM | POA: Diagnosis not present

## 2023-04-17 DIAGNOSIS — Z113 Encounter for screening for infections with a predominantly sexual mode of transmission: Secondary | ICD-10-CM | POA: Diagnosis not present

## 2023-04-18 LAB — CYTOLOGY, (ORAL, ANAL, URETHRAL) ANCILLARY ONLY
Chlamydia: NEGATIVE
Chlamydia: NEGATIVE
Comment: NEGATIVE
Comment: NEGATIVE
Comment: NORMAL
Comment: NORMAL
Neisseria Gonorrhea: NEGATIVE
Neisseria Gonorrhea: NEGATIVE

## 2023-04-18 LAB — URINE CYTOLOGY ANCILLARY ONLY
Chlamydia: NEGATIVE
Comment: NEGATIVE
Comment: NORMAL
Neisseria Gonorrhea: NEGATIVE

## 2023-04-18 MED ORDER — CABOTEGRAVIR ER 600 MG/3ML IM SUER
600.0000 mg | Freq: Once | INTRAMUSCULAR | Status: AC
  Administered 2023-04-17: 600 mg via INTRAMUSCULAR

## 2023-04-19 LAB — HIV-1 RNA QUANT-NO REFLEX-BLD
HIV 1 RNA Quant: NOT DETECTED {copies}/mL
HIV-1 RNA Quant, Log: NOT DETECTED {Log_copies}/mL

## 2023-04-19 LAB — RPR: RPR Ser Ql: NONREACTIVE

## 2023-04-20 ENCOUNTER — Telehealth: Payer: Self-pay

## 2023-04-20 NOTE — Telephone Encounter (Signed)
error 

## 2023-05-18 ENCOUNTER — Telehealth: Payer: Self-pay | Admitting: Plastic Surgery

## 2023-05-18 NOTE — Telephone Encounter (Signed)
Patient wants to know if he can drive himself afterwards, after procedure

## 2023-05-22 ENCOUNTER — Ambulatory Visit (INDEPENDENT_AMBULATORY_CARE_PROVIDER_SITE_OTHER): Payer: BC Managed Care – PPO | Admitting: Plastic Surgery

## 2023-05-22 ENCOUNTER — Ambulatory Visit: Payer: BC Managed Care – PPO | Admitting: Plastic Surgery

## 2023-05-22 VITALS — BP 137/71 | HR 65

## 2023-05-22 DIAGNOSIS — R2233 Localized swelling, mass and lump, upper limb, bilateral: Secondary | ICD-10-CM

## 2023-05-22 DIAGNOSIS — D1722 Benign lipomatous neoplasm of skin and subcutaneous tissue of left arm: Secondary | ICD-10-CM

## 2023-05-22 DIAGNOSIS — R2241 Localized swelling, mass and lump, right lower limb: Secondary | ICD-10-CM

## 2023-05-22 DIAGNOSIS — D1721 Benign lipomatous neoplasm of skin and subcutaneous tissue of right arm: Secondary | ICD-10-CM | POA: Diagnosis not present

## 2023-05-22 DIAGNOSIS — D1723 Benign lipomatous neoplasm of skin and subcutaneous tissue of right leg: Secondary | ICD-10-CM | POA: Diagnosis not present

## 2023-05-22 NOTE — Progress Notes (Signed)
Procedure Note  Preoperative Dx: Multiple lipomas of right arm, left arm and right leg  Postoperative Dx: Same  Procedure:  Excision of 2 lipomas right arm 2 x 2 cm Excision of lipoma left arm 1 x 2 cm Excision of lipoma right leg 1 x 3 cm  Anesthesia: Lidocaine 1% with 1:100,000 epinephrine  Indication for Procedure: Lipomas  Description of Procedure: Risks and complications were explained to the patient and wife.  Consent was confirmed and the patient understands the risks and benefits.  The potential complications and alternatives were explained and the patient consents.  The patient expressed understanding the option of not having the procedure and the risks of a scar.  Time out was called and all information was confirmed to be correct.    Right Arm: The area was prepped and drapped.  Lidocaine 1% with epinephrine was injected in the subcutaneous area.  After waiting several minutes for the local to take affect a #15 blade was used to incise the skin over the mass proximal and distal.  The tissue scissor were used to excise the two lesions.  The skin edges were reapproximated with 4-0 Monocryl.    Left Arm: The area was prepped and drapped.  Lidocaine 1% with epinephrine was injected in the subcutaneous area.  After waiting several minutes for the local to take affect a #15 blade was used to incise the skin over the mass.  The tissue scissor were used to excise the lesion.  The skin edges were reapproximated with 4-0 Monocryl.   Right Leg:  The area was prepped and drapped.  Lidocaine 1% with epinephrine was injected in the subcutaneous area.  After waiting several minutes for the local to take affect a #15 blade was used to incise the skin over the mass.  The tissue scissor were used to excise the lesion.  The skin edges were reapproximated with 4-0 Monocryl. A dressing was applied.  The patient was given instructions on how to care for the area and a follow up appointment.  Chrstopher  tolerated the procedure well and there were no complications.

## 2023-05-22 NOTE — Addendum Note (Signed)
Addended by: Drema Dallas K on: 05/22/2023 04:06 PM   Modules accepted: Orders

## 2023-05-31 ENCOUNTER — Ambulatory Visit (INDEPENDENT_AMBULATORY_CARE_PROVIDER_SITE_OTHER): Payer: BC Managed Care – PPO | Admitting: Student

## 2023-05-31 VITALS — BP 146/75 | HR 76

## 2023-05-31 DIAGNOSIS — R2241 Localized swelling, mass and lump, right lower limb: Secondary | ICD-10-CM

## 2023-05-31 DIAGNOSIS — R2233 Localized swelling, mass and lump, upper limb, bilateral: Secondary | ICD-10-CM

## 2023-05-31 NOTE — Progress Notes (Signed)
Patient is a 42 year old male who underwent excision of 2 lipomas to the right arm, excision of the lipoma to the left arm and excision of the lipoma to the right leg with Dr. Ulice Bold in the clinic on 05/22/2023.  He is about 9 days out from his procedure.  He presents to the clinic today for postprocedural follow-up.  Today, today, patient reports he is doing well.  He has no complaints or concerns.  He denies any issues with the surgical sites.  Denies any fevers or chills.  Chaperone present on exam.  On exam, patient sitting upright in no acute distress.  Incisions to the right arm are intact with Steri-Strip.  Steri-Strip was removed.  Incisions appear to be intact with Monocryl suture.  This was removed.  Patient tolerated well.  No signs of infection on exam, there was a little bit of bruising though..  To the left arm, incisions were intact with Steri-Strips.  Steri-Strip was removed and incisions were intact with Monocryl suture.  A little bit of bruising noted, but no signs of infection.  Monocryl suture was removed.  Patient tolerated well.  To the right thigh, Steri-Strip in place over the incision.  Incision intact with Monocryl suture.  There was some mild ecchymosis, but no signs of infection on exam.  Monocryl suture was removed without any difficulty.  Patient tolerated well.  Recommended that patient apply Vaseline to his incisions daily for the next week or so, and then he may transition to scar creams.  Discussed with him that he should avoid direct sunlight as this may worsen his scar.  Patient expressed understanding.  Patient to follow-up as needed.  Instructed the patient to call if he has any questions or concerns.

## 2023-06-06 ENCOUNTER — Encounter: Payer: Self-pay | Admitting: Student

## 2023-06-06 ENCOUNTER — Encounter: Payer: BC Managed Care – PPO | Admitting: Student

## 2023-06-06 ENCOUNTER — Ambulatory Visit (INDEPENDENT_AMBULATORY_CARE_PROVIDER_SITE_OTHER): Payer: BC Managed Care – PPO | Admitting: Student

## 2023-06-06 VITALS — BP 114/73 | HR 65

## 2023-06-06 DIAGNOSIS — R2233 Localized swelling, mass and lump, upper limb, bilateral: Secondary | ICD-10-CM

## 2023-06-06 MED ORDER — ONDANSETRON HCL 4 MG PO TABS: 4.0000 mg | ORAL_TABLET | Freq: Three times a day (TID) | ORAL | 0 refills | Status: AC | PRN

## 2023-06-06 MED ORDER — CEPHALEXIN 500 MG PO CAPS: 500.0000 mg | ORAL_CAPSULE | Freq: Four times a day (QID) | ORAL | 0 refills | Status: AC

## 2023-06-06 MED ORDER — OXYCODONE HCL 5 MG PO TABS: 5.0000 mg | ORAL_TABLET | Freq: Four times a day (QID) | ORAL | 0 refills | Status: AC | PRN

## 2023-06-06 NOTE — Progress Notes (Signed)
Patient ID: Michael Duncan, male    DOB: 10-Mar-1981, 42 y.o.   MRN: [id number]  Chief Complaint  Patient presents with   Pre-op Exam      ICD-10-CM   1. Mass of arm, bilateral  R22.33        History of Present Illness: Michael Duncan is a 42 y.o.  male  with a history of left arm mass and right arm mass.  He presents for preoperative evaluation for upcoming procedure, excision of mass to right arm and mass to left arm, scheduled for 07/04/2023 with Dr. Ulice Bold.  Patient states he has never had anesthesia before.  He denies any history of cardiac disease.  He denies taking any blood thinners.  He reports he is not a smoker.  Patient denies taking any hormone replacement.  He denies any personal family history of blood clots or clotting diseases.  He denies any recent surgeries, traumas, infections.  He denies any history of stroke or heart attack.  He denies any history of Crohn's disease or ulcerative colitis.  He denies any history of COPD or asthma, or cancer.  He denies any varicosities to his lower extremities.  He denies any recent fevers, chills or changes in his health.  Summary of Previous Visit: Patient was seen for initial consult by Dr. Ulice Bold on 03/02/2023.  At this visit, patient reported he had several masses for many years.  He had 3 on his right arm and 2 on his left arm and 1 on his right anterior thigh.  The mass on the right upper arm and left upper arm were deeper, so these would have to be done in the OR.  Patient had the other masses removed in the clinic on 05/22/2023.  Job: Public relations account executive, planning to take the day of surgery and the day after off  PMH Significant for: Mass of arms bilaterally, mass of leg, male erectile disorder, deviated septum   Past Medical History: Allergies: Allergies  Allergen Reactions   Doxycycline Other (See Comments)    Fixed Drug Reaction     Current Medications:  Current Outpatient Medications:    cabotegravir ER (APRETUDE) 600  MG/3ML injection, Inject 3 mLs (600 mg total) into the muscle every 2 (two) months., Disp: 3 mL, Rfl: 5   cephALEXin (KEFLEX) 500 MG capsule, Take 1 capsule (500 mg total) by mouth 4 (four) times daily for 3 days., Disp: 12 capsule, Rfl: 0   ondansetron (ZOFRAN) 4 MG tablet, Take 1 tablet (4 mg total) by mouth every 8 (eight) hours as needed for up to 10 doses for nausea or vomiting., Disp: 10 tablet, Rfl: 0   oxyCODONE (ROXICODONE) 5 MG immediate release tablet, Take 1 tablet (5 mg total) by mouth every 6 (six) hours as needed for up to 8 doses for severe pain (pain score 7-10)., Disp: 8 tablet, Rfl: 0   terbinafine (LAMISIL) 250 MG tablet, Please take one a day x 7days, repeat every 4 weeks x 4 months, Disp: 28 tablet, Rfl: 0   diclofenac (VOLTAREN) 75 MG EC tablet, Take 1 tablet (75 mg total) by mouth 2 (two) times daily. (Patient not taking: Reported on 09/19/2022), Disp: 50 tablet, Rfl: 2   sildenafil (REVATIO) 20 MG tablet, See admin instructions. (Patient not taking: Reported on 09/19/2022), Disp: , Rfl:   Past Medical Problems: No past medical history on file.  Past Surgical History: No past surgical history on file.  Social History: Social History   Socioeconomic History  Marital status: Married    Spouse name: Not on file   Number of children: Not on file   Years of education: Not on file   Highest education level: Not on file  Occupational History   Not on file  Tobacco Use   Smoking status: Never   Smokeless tobacco: Never  Vaping Use   Vaping status: Never Used  Substance and Sexual Activity   Alcohol use: No   Drug use: No   Sexual activity: Not on file  Other Topics Concern   Not on file  Social History Narrative   Not on file   Social Determinants of Health   Financial Resource Strain: Not on file  Food Insecurity: Not on file  Transportation Needs: Not on file  Physical Activity: Not on file  Stress: Not on file  Social Connections: Not on file  Intimate  Partner Violence: Not on file    Family History: No family history on file.  Review of Systems: Denies any recent fevers or chills  Physical Exam: Vital Signs BP 114/73 (BP Location: Left Arm, Patient Position: Sitting, Cuff Size: Large)   Pulse 65   SpO2 97%   Physical Exam  Constitutional:      General: Not in acute distress.    Appearance: Normal appearance. Not ill-appearing.  HENT:     Head: Normocephalic and atraumatic.  Eyes:     Pupils: Pupils are equal, round Neck:     Musculoskeletal: Normal range of motion.  Cardiovascular:     Rate and Rhythm: Normal rate Pulmonary:     Effort: Pulmonary effort is normal. No respiratory distress.  Musculoskeletal: Normal range of motion.  Skin:    General: Skin is warm and dry.     Findings: No erythema or rash.  Neurological:     Mental Status: Alert and oriented to person, place, and time. Mental status is at baseline.  Psychiatric:        Mood and Affect: Mood normal.        Behavior: Behavior normal.    Assessment/Plan: The patient is scheduled for excision of bilateral arm masses with Dr. Ulice Bold.  Risks, benefits, and alternatives of procedure discussed, questions answered and consent obtained.    Smoking Status: Non-smoker; Counseling Given?  N/A  Caprini Score: 4; Risk Factors include: Age, BMI > 25, and length of planned surgery. Recommendation for mechanical prophylaxis. Encourage early ambulation.   Pictures obtained: @consult   Post-op Rx sent to pharmacy: Oxycodone, Zofran, Keflex  Instructed patient to hold Voltaren 1 week prior to surgery as well as any multivitamins or supplements.  Patient expressed understanding.  Patient was provided with the  General Surgical Risk consent document and Pain Medication Agreement prior to their appointment.  They had adequate time to read through the risk consent documents and Pain Medication Agreement. We also discussed them in person together during this preop  appointment. All of their questions were answered to their satisfaction.  Recommended calling if they have any further questions.  Risk consent form and Pain Medication Agreement to be scanned into patient's chart.  The consent was obtained with risks and complications reviewed which included bleeding, pain, scar, infection and the risk of anesthesia.  The patients questions were answered to the patients expressed satisfaction.    Electronically signed by: Laurena Spies, PA-C 06/06/2023 3:49 PM

## 2023-06-06 NOTE — Progress Notes (Unsigned)
Patient ID: Michael Duncan, male    DOB: February 07, 1981, 42 y.o.   MRN: [id number]  No chief complaint on file.   No diagnosis found.   History of Present Illness: Michael Duncan is a 42 y.o.  male  with a history of left arm mass and right arm mass.  He presents for preoperative evaluation for upcoming procedure, excision of mass to right arm and mass to left arm, scheduled for 07/04/2023 with Dr. Ulice Bold.  The patient {HAS HAS [id number]:[id number]} had problems with anesthesia. ***  Summary of Previous Visit: Patient was seen for initial consult by Dr. Ulice Bold on 03/02/2023.  At this visit, patient reported he had several masses for many years.  He had 3 on his right arm and 2 on his left arm and 1 on his right anterior thigh.  The mass on the right upper arm and left upper arm were deeper, so these would have to be done in the OR.  Patient had the other masses removed in the clinic on 05/22/2023.  Job: ***  PMH Significant for: ***   Past Medical History: Allergies: Allergies  Allergen Reactions   Doxycycline Other (See Comments)    Fixed Drug Reaction     Current Medications:  Current Outpatient Medications:    cabotegravir ER (APRETUDE) 600 MG/3ML injection, Inject 3 mLs (600 mg total) into the muscle every 2 (two) months., Disp: 3 mL, Rfl: 5   diclofenac (VOLTAREN) 75 MG EC tablet, Take 1 tablet (75 mg total) by mouth 2 (two) times daily. (Patient not taking: Reported on 09/19/2022), Disp: 50 tablet, Rfl: 2   sildenafil (REVATIO) 20 MG tablet, See admin instructions. (Patient not taking: Reported on 09/19/2022), Disp: , Rfl:    terbinafine (LAMISIL) 250 MG tablet, Please take one a day x 7days, repeat every 4 weeks x 4 months, Disp: 28 tablet, Rfl: 0  Past Medical Problems: No past medical history on file.  Past Surgical History: No past surgical history on file.  Social History: Social History   Socioeconomic History   Marital status: Married    Spouse name: Not on file    Number of children: Not on file   Years of education: Not on file   Highest education level: Not on file  Occupational History   Not on file  Tobacco Use   Smoking status: Never   Smokeless tobacco: Never  Vaping Use   Vaping status: Never Used  Substance and Sexual Activity   Alcohol use: No   Drug use: No   Sexual activity: Not on file  Other Topics Concern   Not on file  Social History Narrative   Not on file   Social Determinants of Health   Financial Resource Strain: Not on file  Food Insecurity: Not on file  Transportation Needs: Not on file  Physical Activity: Not on file  Stress: Not on file  Social Connections: Not on file  Intimate Partner Violence: Not on file    Family History: No family history on file.  Review of Systems: ROS  Physical Exam: Vital Signs There were no vitals taken for this visit.  Physical Exam *** Constitutional:      General: Not in acute distress.    Appearance: Normal appearance. Not ill-appearing.  HENT:     Head: Normocephalic and atraumatic.  Eyes:     Pupils: Pupils are equal, round Neck:     Musculoskeletal: Normal range of motion.  Cardiovascular:     Rate and  Rhythm: Normal rate    Pulses: Normal pulses.  Pulmonary:     Effort: Pulmonary effort is normal. No respiratory distress.  Abdominal:     General: Abdomen is flat. There is no distension.  Musculoskeletal: Normal range of motion.  Skin:    General: Skin is warm and dry.     Findings: No erythema or rash.  Neurological:     General: No focal deficit present.     Mental Status: Alert and oriented to person, place, and time. Mental status is at baseline.     Motor: No weakness.  Psychiatric:        Mood and Affect: Mood normal.        Behavior: Behavior normal.    Assessment/Plan: The patient is scheduled for *** with Dr. {IONGE:[id number]::"XLKGMW","[id number]"}.  Risks, benefits, and alternatives of procedure discussed, questions answered and consent  obtained.    Smoking Status: ***; Counseling Given? *** Last Mammogram: ***; Results: ***  Caprini Score: ***; Risk Factors include: ***, BMI *** 25, and length of planned surgery. Recommendation for mechanical *** prophylaxis. Encourage early ambulation.   Pictures obtained: @consult ***  Post-op Rx sent to pharmacy: {Blank:[id number]::"Oxycodone, Zofran, Keflex","Oxycodone, Zofran"}  Patient was provided with the *** General Surgical Risk consent document and Pain Medication Agreement prior to their appointment.  They had adequate time to read through the risk consent documents and Pain Medication Agreement. We also discussed them in person together during this preop appointment. All of their questions were answered to their satisfaction.  Recommended calling if they have any further questions.  Risk consent form and Pain Medication Agreement to be scanned into patient's chart.  ***   Electronically signed by: Laurena Spies, PA-C 06/06/2023 8:20 AM

## 2023-06-15 ENCOUNTER — Telehealth: Payer: Self-pay | Admitting: Student

## 2023-06-15 ENCOUNTER — Encounter: Payer: BC Managed Care – PPO | Admitting: Student

## 2023-06-15 NOTE — Progress Notes (Unsigned)
HPI: Michael Duncan is a 42 y.o. male who presents to the [facility] pharmacy clinic for Apretude administration and HIV PrEP follow up.  Insured   [x]    Uninsured  []    Patient Active Problem List   Diagnosis Date Noted   Mass of arm, bilateral 03/02/2023   Mass of leg, right 03/02/2023   Male erectile disorder (CODE) 05/27/2021   Male infertility 05/27/2021   Tension type headache 05/27/2021   Chronic pansinusitis 12/03/2017   Deviated septum 12/03/2017   Hypertrophy, nasal, turbinate 12/03/2017    Patient's Medications  New Prescriptions   No medications on file  Previous Medications   CABOTEGRAVIR ER (APRETUDE) 600 MG/3ML INJECTION    Inject 3 mLs (600 mg total) into the muscle every 2 (two) months.   DICLOFENAC (VOLTAREN) 75 MG EC TABLET    Take 1 tablet (75 mg total) by mouth 2 (two) times daily.   ONDANSETRON (ZOFRAN) 4 MG TABLET    Take 1 tablet (4 mg total) by mouth every 8 (eight) hours as needed for up to 10 doses for nausea or vomiting.   OXYCODONE (ROXICODONE) 5 MG IMMEDIATE RELEASE TABLET    Take 1 tablet (5 mg total) by mouth every 6 (six) hours as needed for up to 8 doses for severe pain (pain score 7-10).   SILDENAFIL (REVATIO) 20 MG TABLET    See admin instructions.   TERBINAFINE (LAMISIL) 250 MG TABLET    Please take one a day x 7days, repeat every 4 weeks x 4 months  Modified Medications   No medications on file  Discontinued Medications   No medications on file    Allergies: Allergies  Allergen Reactions   Doxycycline Other (See Comments)    Fixed Drug Reaction     Labs: Lab Results  Component Value Date   HIV1RNAQUANT Not Detected 04/17/2023   HIV1RNAQUANT Not Detected 02/14/2023   HIV1RNAQUANT Not Detected 12/13/2022    RPR and STI Lab Results  Component Value Date   LABRPR NON-REACTIVE 04/17/2023   LABRPR NON-REACTIVE 02/14/2023   LABRPR NON-REACTIVE 10/11/2022   LABRPR NON-REACTIVE 06/12/2022   LABRPR NON-REACTIVE 10/18/2021    STI Results  GC GC CT CT  04/17/2023  1:42 PM Negative    Negative    Negative   Negative    Negative    Negative    02/14/2023  1:59 PM Negative    Negative    Negative   Negative    Negative    Negative    10/11/2022  2:52 PM Negative    Negative    Negative   Negative    Negative    Positive    06/12/2022  2:39 PM Negative   Negative    10/19/2021 11:15 AM Negative   Negative    06/13/2021 11:22 AM Negative   Negative    06/13/2021 11:12 AM Negative    Negative   Negative    Negative    02/16/2021 10:59 AM Negative   Negative    10/12/2016  9:35 AM  NOT DETECTED   NOT DETECTED     Hepatitis B Lab Results  Component Value Date   HEPBSAB POS (A) 10/12/2016   HEPBSAG NEGATIVE 10/04/2016   HEPBCAB NON REACTIVE 10/12/2016   Hepatitis C Lab Results  Component Value Date   HEPCAB NON-REACTIVE 12/09/2020   Hepatitis A Lab Results  Component Value Date   HAV REACTIVE (A) 10/11/2022   Lipids: Lab Results  Component Value Date  CHOL 140 10/16/2018   TRIG 76 10/16/2018   HDL 43 10/16/2018   CHOLHDL 3.3 10/16/2018   VLDL 13 10/12/2016   LDLCALC 81 10/16/2018    TARGET DATE: The 24th  Assessment: Herald presents today for his Apretude injection and to follow up for HIV PrEP. No issues with past injections. Denies any symptoms of acute HIV. Last STI screening was on 04/17/23 and was negative. No known exposures to any STIs since last visit. Agrees to full STI testing today with RPR and oral/urine/rectal cytologies.   Per [employer] guidelines, a rapid HIV test should be drawn prior to Apretude administration. Due to state shortage of rapid HIV tests, this is temporarily unable to be done. Per decision from [facility] physicians, we will proceed with Apretude administration at this time without a negative rapid HIV test beforehand. HIV RNA was collected today and is in process.  Administered cabotegravir 600mg /85mL in left upper outer quadrant of the gluteal muscle. Will see him  back in 2 months for injection, labs, and HIV PrEP follow up.  Plan: - Apretude injection administered - HIV RNA, RPR, and urine/rectal/pharyngeal cytologies for GC/chlamydia today - Next injection, labs, and PrEP follow up appointment scheduled for 08/20/23 and 10/16/23 with me - Call with any issues or questions  Prosperity Darrough L. Harald Quevedo, PharmD, BCIDP, AAHIVP, CPP Clinical Pharmacist Practitioner Infectious Diseases Clinical Pharmacist [facility]

## 2023-06-15 NOTE — Telephone Encounter (Signed)
Called lvmail to go over and confirm future post ops

## 2023-06-18 ENCOUNTER — Ambulatory Visit (INDEPENDENT_AMBULATORY_CARE_PROVIDER_SITE_OTHER): Payer: BC Managed Care – PPO | Admitting: Pharmacist

## 2023-06-18 ENCOUNTER — Other Ambulatory Visit: Payer: Self-pay

## 2023-06-18 DIAGNOSIS — Z113 Encounter for screening for infections with a predominantly sexual mode of transmission: Secondary | ICD-10-CM | POA: Diagnosis not present

## 2023-06-18 DIAGNOSIS — Z2981 Encounter for HIV pre-exposure prophylaxis: Secondary | ICD-10-CM

## 2023-06-18 MED ORDER — CABOTEGRAVIR ER 600 MG/3ML IM SUER
600.0000 mg | Freq: Once | INTRAMUSCULAR | Status: AC
  Administered 2023-06-18: 600 mg via INTRAMUSCULAR

## 2023-06-19 LAB — RPR: RPR Ser Ql: NONREACTIVE

## 2023-06-19 LAB — HIV-1 RNA QUANT-NO REFLEX-BLD
HIV 1 RNA Quant: NOT DETECTED {copies}/mL
HIV-1 RNA Quant, Log: NOT DETECTED {Log}

## 2023-06-19 LAB — CT/NG RNA, TMA RECTAL
Chlamydia Trachomatis RNA: NOT DETECTED
Neisseria Gonorrhoeae RNA: NOT DETECTED

## 2023-06-19 LAB — GC/CHLAMYDIA PROBE, AMP (THROAT)
Chlamydia trachomatis RNA: NOT DETECTED
Neisseria gonorrhoeae RNA: NOT DETECTED

## 2023-06-19 LAB — C. TRACHOMATIS/N. GONORRHOEAE RNA
C. trachomatis RNA, TMA: NOT DETECTED
N. gonorrhoeae RNA, TMA: NOT DETECTED

## 2023-06-26 ENCOUNTER — Encounter (HOSPITAL_BASED_OUTPATIENT_CLINIC_OR_DEPARTMENT_OTHER): Payer: Self-pay | Admitting: Plastic Surgery

## 2023-06-26 ENCOUNTER — Telehealth: Payer: Self-pay | Admitting: Plastic Surgery

## 2023-06-26 ENCOUNTER — Ambulatory Visit (INDEPENDENT_AMBULATORY_CARE_PROVIDER_SITE_OTHER): Payer: BC Managed Care – PPO | Admitting: Plastic Surgery

## 2023-06-26 DIAGNOSIS — R2233 Localized swelling, mass and lump, upper limb, bilateral: Secondary | ICD-10-CM

## 2023-06-26 NOTE — Progress Notes (Signed)
 The patient is a 42 yrs old male here for follow up after removal of several lipomas.  All areas of arms are healing well.  Can start wearing silicone.  F/U as needed.

## 2023-06-26 NOTE — Telephone Encounter (Signed)
Pt did not answer, lvmail to see if he could come early

## 2023-07-03 ENCOUNTER — Encounter (HOSPITAL_BASED_OUTPATIENT_CLINIC_OR_DEPARTMENT_OTHER): Payer: Self-pay | Admitting: Plastic Surgery

## 2023-07-03 NOTE — Anesthesia Preprocedure Evaluation (Addendum)
 Anesthesia Evaluation  Patient identified by MRN, date of birth, ID band Patient awake    Reviewed: Allergy & Precautions, NPO status , Patient's Chart, lab work & pertinent test results  Airway Mallampati: II       Dental no notable dental hx. (+) Teeth Intact   Pulmonary neg pulmonary ROS   Pulmonary exam normal breath sounds clear to auscultation       Cardiovascular negative cardio ROS Normal cardiovascular exam Rhythm:Regular Rate:Normal     Neuro/Psych  Headaches  negative psych ROS   GI/Hepatic negative GI ROS, Neg liver ROS,,,  Endo/Other  Obesity  Renal/GU negative Renal ROS  negative genitourinary   Musculoskeletal Bilateral arm masses   Abdominal  (+) + obese  Peds  Hematology negative hematology ROS (+)   Anesthesia Other Findings   Reproductive/Obstetrics negative OB ROS                              Anesthesia Physical Anesthesia Plan  ASA: 2  Anesthesia Plan: General   Post-op Pain Management: Minimal or no pain anticipated   Induction: Intravenous  PONV Risk Score and Plan: 3 and Treatment may vary due to age or medical condition, Midazolam  and Ondansetron   Airway Management Planned: LMA  Additional Equipment: None  Intra-op Plan:   Post-operative Plan: Extubation in OR  Informed Consent: I have reviewed the patients History and Physical, chart, labs and discussed the procedure including the risks, benefits and alternatives for the proposed anesthesia with the patient or authorized representative who has indicated his/her understanding and acceptance.     Dental advisory given  Plan Discussed with: CRNA and Anesthesiologist  Anesthesia Plan Comments:         Anesthesia Quick Evaluation

## 2023-07-04 ENCOUNTER — Encounter (HOSPITAL_BASED_OUTPATIENT_CLINIC_OR_DEPARTMENT_OTHER): Payer: Self-pay | Admitting: Plastic Surgery

## 2023-07-04 ENCOUNTER — Encounter (HOSPITAL_BASED_OUTPATIENT_CLINIC_OR_DEPARTMENT_OTHER)
Admission: RE | Disposition: A | Discharge status: Home / Self Care | Payer: Self-pay | Source: Home / Self Care | Attending: Plastic Surgery

## 2023-07-04 ENCOUNTER — Other Ambulatory Visit: Payer: Self-pay

## 2023-07-04 ENCOUNTER — Ambulatory Visit (HOSPITAL_BASED_OUTPATIENT_CLINIC_OR_DEPARTMENT_OTHER): Payer: Self-pay | Admitting: Anesthesiology

## 2023-07-04 ENCOUNTER — Ambulatory Visit (HOSPITAL_BASED_OUTPATIENT_CLINIC_OR_DEPARTMENT_OTHER)
Admission: RE | Admit: 2023-07-04 | Discharge: 2023-07-04 | Disposition: A | Discharge status: Home / Self Care | Payer: BC Managed Care – PPO | Attending: Plastic Surgery | Admitting: Plastic Surgery

## 2023-07-04 DIAGNOSIS — D1721 Benign lipomatous neoplasm of skin and subcutaneous tissue of right arm: Secondary | ICD-10-CM | POA: Diagnosis not present

## 2023-07-04 DIAGNOSIS — D1722 Benign lipomatous neoplasm of skin and subcutaneous tissue of left arm: Secondary | ICD-10-CM | POA: Diagnosis not present

## 2023-07-04 DIAGNOSIS — Z6831 Body mass index (BMI) 31.0-31.9, adult: Secondary | ICD-10-CM | POA: Diagnosis not present

## 2023-07-04 DIAGNOSIS — Z01818 Encounter for other preprocedural examination: Secondary | ICD-10-CM

## 2023-07-04 DIAGNOSIS — E669 Obesity, unspecified: Secondary | ICD-10-CM | POA: Insufficient documentation

## 2023-07-04 HISTORY — PX: MASS EXCISION: SHX2000

## 2023-07-04 SURGERY — MINOR EXCISION OF MASS
Anesthesia: General | Site: Arm Lower | Laterality: Bilateral

## 2023-07-04 MED ORDER — MIDAZOLAM HCL 5 MG/5ML IJ SOLN
INTRAMUSCULAR | Status: DC | PRN
  Administered 2023-07-04: 2 mg via INTRAVENOUS

## 2023-07-04 MED ORDER — LIDOCAINE 2% (20 MG/ML) 5 ML SYRINGE
INTRAMUSCULAR | Status: DC | PRN
  Administered 2023-07-04: 100 mg via INTRAVENOUS

## 2023-07-04 MED ORDER — BUPIVACAINE HCL (PF) 0.25 % IJ SOLN
INTRAMUSCULAR | Status: AC
  Filled 2023-07-04: qty 30

## 2023-07-04 MED ORDER — LIDOCAINE 2% (20 MG/ML) 5 ML SYRINGE
INTRAMUSCULAR | Status: AC
  Filled 2023-07-04: qty 5

## 2023-07-04 MED ORDER — LACTATED RINGERS IV SOLN: INTRAVENOUS | Status: DC

## 2023-07-04 MED ORDER — OXYCODONE HCL 5 MG PO TABS
5.0000 mg | ORAL_TABLET | Freq: Once | ORAL | Status: DC | PRN
Start: 2023-07-04 — End: 2023-07-04

## 2023-07-04 MED ORDER — DEXAMETHASONE SODIUM PHOSPHATE 10 MG/ML IJ SOLN
INTRAMUSCULAR | Status: AC
Start: 2023-07-04 — End: ?
  Filled 2023-07-04: qty 1

## 2023-07-04 MED ORDER — OXYCODONE HCL 5 MG PO TABS: 5.0000 mg | ORAL_TABLET | ORAL | Status: DC | PRN

## 2023-07-04 MED ORDER — LIDOCAINE-EPINEPHRINE 1 %-1:100000 IJ SOLN
INTRAMUSCULAR | Status: AC
  Filled 2023-07-04: qty 1

## 2023-07-04 MED ORDER — LIDOCAINE-EPINEPHRINE (PF) 1 %-1:200000 IJ SOLN
INTRAMUSCULAR | Status: DC | PRN
  Administered 2023-07-04: 7 mL

## 2023-07-04 MED ORDER — ATROPINE SULFATE 0.4 MG/ML IV SOLN
INTRAVENOUS | Status: AC
  Filled 2023-07-04: qty 1

## 2023-07-04 MED ORDER — SODIUM CHLORIDE 0.9% FLUSH: 3.0000 mL | Freq: Two times a day (BID) | INTRAVENOUS | Status: DC

## 2023-07-04 MED ORDER — ACETAMINOPHEN 325 MG PO TABS
650.0000 mg | ORAL_TABLET | ORAL | Status: DC | PRN
Start: 2023-07-04 — End: 2023-07-04

## 2023-07-04 MED ORDER — SCOPOLAMINE 1 MG/3DAYS TD PT72
MEDICATED_PATCH | TRANSDERMAL | Status: AC
  Filled 2023-07-04: qty 1

## 2023-07-04 MED ORDER — ONDANSETRON HCL 4 MG/2ML IJ SOLN
INTRAMUSCULAR | Status: AC
  Filled 2023-07-04: qty 2

## 2023-07-04 MED ORDER — FENTANYL CITRATE (PF) 100 MCG/2ML IJ SOLN
INTRAMUSCULAR | Status: DC | PRN
  Administered 2023-07-04: 100 ug via INTRAVENOUS

## 2023-07-04 MED ORDER — FENTANYL CITRATE (PF) 100 MCG/2ML IJ SOLN: 25.0000 ug | INTRAMUSCULAR | Status: DC | PRN

## 2023-07-04 MED ORDER — SODIUM CHLORIDE 0.9 % IV SOLN: INTRAVENOUS | Status: DC | PRN

## 2023-07-04 MED ORDER — METHOCARBAMOL 500 MG PO TABS: 500.0000 mg | ORAL_TABLET | Freq: Four times a day (QID) | ORAL | Status: DC | PRN

## 2023-07-04 MED ORDER — OXYCODONE HCL 5 MG/5ML PO SOLN: 5.0000 mg | Freq: Once | ORAL | Status: DC | PRN

## 2023-07-04 MED ORDER — DEXAMETHASONE SODIUM PHOSPHATE 4 MG/ML IJ SOLN
INTRAMUSCULAR | Status: DC | PRN
  Administered 2023-07-04: 10 mg via INTRAVENOUS

## 2023-07-04 MED ORDER — LIDOCAINE HCL (PF) 1 % IJ SOLN
INTRAMUSCULAR | Status: AC
  Filled 2023-07-04: qty 30

## 2023-07-04 MED ORDER — EPINEPHRINE PF 1 MG/ML IJ SOLN
INTRAMUSCULAR | Status: AC
  Filled 2023-07-04: qty 1

## 2023-07-04 MED ORDER — ONDANSETRON HCL 4 MG/2ML IJ SOLN: 4.0000 mg | Freq: Once | INTRAMUSCULAR | Status: DC | PRN

## 2023-07-04 MED ORDER — ACETAMINOPHEN 325 MG RE SUPP: 650.0000 mg | RECTAL | Status: DC | PRN

## 2023-07-04 MED ORDER — PROPOFOL 10 MG/ML IV BOLUS
INTRAVENOUS | Status: DC | PRN
  Administered 2023-07-04: 200 mg via INTRAVENOUS

## 2023-07-04 MED ORDER — SODIUM CHLORIDE 0.9 % IV SOLN: 250.0000 mL | INTRAVENOUS | Status: DC | PRN

## 2023-07-04 MED ORDER — SODIUM CHLORIDE 0.9% FLUSH: 3.0000 mL | INTRAVENOUS | Status: DC | PRN

## 2023-07-04 MED ORDER — 0.9 % SODIUM CHLORIDE (POUR BTL) OPTIME
TOPICAL | Status: DC | PRN
Start: 2023-07-04 — End: 2023-07-04
  Administered 2023-07-04: 300 mL

## 2023-07-04 MED ORDER — SUCCINYLCHOLINE CHLORIDE 200 MG/10ML IV SOSY
PREFILLED_SYRINGE | INTRAVENOUS | Status: AC
  Filled 2023-07-04: qty 10

## 2023-07-04 MED ORDER — CHLORHEXIDINE GLUCONATE CLOTH 2 % EX PADS
6.0000 | MEDICATED_PAD | Freq: Once | CUTANEOUS | Status: DC
Start: 2023-07-04 — End: 2023-07-04

## 2023-07-04 MED ORDER — FENTANYL CITRATE (PF) 100 MCG/2ML IJ SOLN
INTRAMUSCULAR | Status: AC
  Filled 2023-07-04: qty 2

## 2023-07-04 MED ORDER — ONDANSETRON HCL 4 MG/2ML IJ SOLN
INTRAMUSCULAR | Status: DC | PRN
  Administered 2023-07-04: 4 mg via INTRAVENOUS

## 2023-07-04 MED ORDER — EPHEDRINE 5 MG/ML INJ
INTRAVENOUS | Status: AC
  Filled 2023-07-04: qty 5

## 2023-07-04 MED ORDER — PHENYLEPHRINE 80 MCG/ML (10ML) SYRINGE FOR IV PUSH (FOR BLOOD PRESSURE SUPPORT)
PREFILLED_SYRINGE | INTRAVENOUS | Status: AC
  Filled 2023-07-04: qty 10

## 2023-07-04 MED ORDER — CEFAZOLIN SODIUM-DEXTROSE 2-4 GM/100ML-% IV SOLN
2.0000 g | INTRAVENOUS | Status: AC
  Administered 2023-07-04: 2 g via INTRAVENOUS

## 2023-07-04 MED ORDER — MIDAZOLAM HCL 2 MG/2ML IJ SOLN
INTRAMUSCULAR | Status: AC
  Filled 2023-07-04: qty 2

## 2023-07-04 SURGICAL SUPPLY — 51 items
BAND RUBBER #18 3X1/16 STRL (MISCELLANEOUS) IMPLANT
BENZOIN TINCTURE PRP APPL 2/3 (GAUZE/BANDAGES/DRESSINGS) IMPLANT
BLADE CLIPPER SURG (BLADE) IMPLANT
BLADE SURG 15 STRL LF DISP TIS (BLADE) ×1 IMPLANT
BNDG ELASTIC 2INX 5YD STR LF (GAUZE/BANDAGES/DRESSINGS) IMPLANT
CANISTER SUCT 1200ML W/VALVE (MISCELLANEOUS) IMPLANT
CLEANER CAUTERY TIP PAD (MISCELLANEOUS) IMPLANT
CORD BIPOLAR FORCEPS 12FT (ELECTRODE) IMPLANT
COVER BACK TABLE 60X90IN (DRAPES) ×1 IMPLANT
COVER MAYO STAND STRL (DRAPES) ×1 IMPLANT
DERMABOND ADVANCED .7 DNX12 (GAUZE/BANDAGES/DRESSINGS) IMPLANT
DRAPE U-SHAPE 76X120 STRL (DRAPES) ×1 IMPLANT
DRESSING MEPILEX FLEX 4X4 (GAUZE/BANDAGES/DRESSINGS) IMPLANT
DRSG MEPILEX FLEX 4X4 (GAUZE/BANDAGES/DRESSINGS) ×2
ELECT COATED BLADE 2.86 ST (ELECTRODE) IMPLANT
ELECT NDL BLADE 2-5/6 (NEEDLE) ×1 IMPLANT
ELECT NEEDLE BLADE 2-5/6 (NEEDLE)
ELECT REM PT RETURN 9FT ADLT (ELECTROSURGICAL) ×1
ELECT REM PT RETURN 9FT PED (ELECTROSURGICAL)
ELECTRODE REM PT RETRN 9FT PED (ELECTROSURGICAL) IMPLANT
ELECTRODE REM PT RTRN 9FT ADLT (ELECTROSURGICAL) IMPLANT
GAUZE SPONGE 2X2 STRL 8-PLY (GAUZE/BANDAGES/DRESSINGS) IMPLANT
GAUZE SPONGE 4X4 12PLY STRL LF (GAUZE/BANDAGES/DRESSINGS) IMPLANT
GAUZE STRETCH 2X75IN STRL (MISCELLANEOUS) IMPLANT
GAUZE XEROFORM 1X8 LF (GAUZE/BANDAGES/DRESSINGS) IMPLANT
GLOVE BIO SURGEON STRL SZ 6.5 (GLOVE) ×2 IMPLANT
GOWN STRL REUS W/ TWL LRG LVL3 (GOWN DISPOSABLE) ×1 IMPLANT
NDL HYPO 30GX1 BEV (NEEDLE) IMPLANT
NDL PRECISIONGLIDE 27X1.5 (NEEDLE) ×1 IMPLANT
NEEDLE HYPO 30GX1 BEV (NEEDLE)
NEEDLE PRECISIONGLIDE 27X1.5 (NEEDLE)
NS IRRIG 1000ML POUR BTL (IV SOLUTION) IMPLANT
PACK BASIN DAY SURGERY FS (CUSTOM PROCEDURE TRAY) ×1 IMPLANT
PENCIL SMOKE EVACUATOR (MISCELLANEOUS) IMPLANT
SHEET MEDIUM DRAPE 40X70 STRL (DRAPES) IMPLANT
STRIP CLOSURE SKIN 1/2X4 (GAUZE/BANDAGES/DRESSINGS) IMPLANT
SUCTION TUBE FRAZIER 10FR DISP (SUCTIONS) IMPLANT
SUT CHROMIC 4 0 P 3 18 (SUTURE) IMPLANT
SUT ETHILON 4 0 PS 2 18 (SUTURE) IMPLANT
SUT MNCRL AB 4-0 PS2 18 (SUTURE) IMPLANT
SUT MON AB 5-0 P3 18 (SUTURE) IMPLANT
SUT MON AB 5-0 PS2 18 (SUTURE) IMPLANT
SUT NYLON ETHILON 5-0 P-3 1X18 (SUTURE) IMPLANT
SUT VIC AB 4-0 PS2 18 (SUTURE) IMPLANT
SUT VIC AB 5-0 P-3 18X BRD (SUTURE) IMPLANT
SUT VICRYL RAPIDE 4-0 (SUTURE) IMPLANT
SYR 10ML LL (SYRINGE) IMPLANT
SYR BULB EAR ULCER 3OZ GRN STR (SYRINGE) IMPLANT
SYR CONTROL 10ML LL (SYRINGE) ×1 IMPLANT
TRAY DSU PREP LF (CUSTOM PROCEDURE TRAY) ×1 IMPLANT
TUBE CONNECTING 20X1/4 (TUBING) IMPLANT

## 2023-07-04 NOTE — Anesthesia Postprocedure Evaluation (Signed)
 Anesthesia Post Note  Patient: Michael Duncan  Procedure(s) Performed: Excision of mass right arm and left arm (Bilateral: Arm Lower)     Patient location during evaluation: PACU Anesthesia Type: General Level of consciousness: awake and alert and oriented Pain management: pain level controlled Vital Signs Assessment: post-procedure vital signs reviewed and stable Respiratory status: spontaneous breathing, nonlabored ventilation and respiratory function stable Cardiovascular status: blood pressure returned to baseline and stable Postop Assessment: no apparent nausea or vomiting Anesthetic complications: no   No notable events documented.  Last Vitals:  Vitals:   07/04/23 0915 07/04/23 0935  BP: (!) 153/84 (!) 151/85  Pulse: 81 68  Resp: 13 14  Temp:  (!) 36.4 C  SpO2: 94% 94%    Last Pain:  Vitals:   07/04/23 0935  TempSrc:   PainSc: 0-No pain                 Maleah Rabago A.

## 2023-07-04 NOTE — Interval H&P Note (Signed)
 History and Physical Interval Note:  07/04/2023 7:39 AM  Michael Duncan  has presented today for surgery, with the diagnosis of Mass of arm, bilateral.  The various methods of treatment have been discussed with the patient and family. After consideration of risks, benefits and other options for treatment, the patient has consented to  Procedure(s): Excision of mass right arm and left arm (Bilateral) as a surgical intervention.  The patient's history has been reviewed, patient examined, no change in status, stable for surgery.  I have reviewed the patient's chart and labs.  Questions were answered to the patient's satisfaction.     Estefana RAMAN Careen Mauch

## 2023-07-04 NOTE — Anesthesia Procedure Notes (Signed)
 Procedure Name: LMA Insertion Date/Time: 07/04/2023 8:40 AM  Performed by: Emilio Rock BIRCH, CRNAPre-anesthesia Checklist: Patient identified, Emergency Drugs available, Suction available and Patient being monitored Patient Re-evaluated:Patient Re-evaluated prior to induction Oxygen Delivery Method: Circle System Utilized Preoxygenation: Pre-oxygenation with 100% oxygen Induction Type: IV induction Ventilation: Mask ventilation without difficulty LMA: LMA inserted LMA Size: 5.0 Number of attempts: 1 Airway Equipment and Method: bite block Placement Confirmation: positive ETCO2 Tube secured with: Tape Dental Injury: Teeth and Oropharynx as per pre-operative assessment

## 2023-07-04 NOTE — Op Note (Addendum)
 DATE OF OPERATION: 07/04/2023  LOCATION: [facility]  PREOPERATIVE DIAGNOSIS: Bilateral arm mass / lipomas  POSTOPERATIVE DIAGNOSIS: Same  PROCEDURE: Excision of bilateral arm mass / lipomas x 3 1. Right 2 x 2 cm 2. Left 1.5 x 1.5 cm and 1.5 x 2 cm  SURGEON: Estefana Fritter, DO  ASSISTANT: Donnice Edelson, PA  EBL: none  CONDITION: Stable  COMPLICATIONS: None  INDICATION: The patient, Michael Duncan, is a 43 y.o. male born on 08/19/1980, is here for treatment of bilateral arm masses.   PROCEDURE DETAILS:  The patient was seen prior to surgery and marked.  The IV antibiotics were given. The patient was taken to the operating room and given a general anesthetic. A standard time out was performed and all information was confirmed by those in the room. SCDs were placed.   The patient was prepped and draped.  Local was injected at each site for intraoperative hemostasis.    Right: The area was marked and after the local was placed the #15 blade was used to make an incision over the lesion.  The tissue scissors and bovie was used to release the lesion from the surrounding tissue.  The 2 x 2 cm lesion was removed in total.  The deep layers were closed with the 4-0 Monocryl followed by the skin closure with the 5-0 Monocryl. The lesion was subcuticular.  Left: The area was marked and after the local was placed the #15 blade was used to make an incision over the lesion.  The tissue scissors and bovie was used to release the lesion from the surrounding tissue.  The 1.5 x 1.5 cm lesion was removed and the site checked.  There was another one under it and it was removed as well.  It was 1.5 x 2 cm in size.  The deep layers were closed with the 4-0 Monocryl followed by the skin closure with the 5-0 Monocryl. Derma bond and steri strips were applied with a sterile dressing.  The specimens were sent to pathology.  The first lesion was subcuticular. The second was deep to the first and in  the fatty tissue. The patient was allowed to wake up and taken to recovery room in stable condition at the end of the case. The family was notified at the end of the case.   The advanced practice practitioner (APP) assisted throughout the case.  The APP was essential in retraction and counter traction when needed to make the case progress smoothly.  This retraction and assistance made it possible to see the tissue plans for the procedure.  The assistance was needed for blood control, tissue re-approximation and assisted with closure of the incision site.\

## 2023-07-04 NOTE — Transfer of Care (Signed)
 Immediate Anesthesia Transfer of Care Note  Patient: Michael Duncan  Procedure(s) Performed: Excision of mass right arm and left arm (Bilateral: Arm Lower)  Patient Location: PACU  Anesthesia Type:General  Level of Consciousness: sedated  Airway & Oxygen Therapy: Patient Spontanous Breathing and Patient connected to face mask oxygen  Post-op Assessment: Report given to RN and Post -op Vital signs reviewed and stable  Post vital signs: Reviewed and stable  Last Vitals:  Vitals Value Taken Time  BP    Temp    Pulse    Resp    SpO2      Last Pain:  Vitals:   07/04/23 0723  TempSrc: Oral  PainSc: 0-No pain      Patients Stated Pain Goal: 3 (07/04/23 0723)  Complications: No notable events documented.

## 2023-07-04 NOTE — Discharge Instructions (Addendum)

## 2023-07-05 ENCOUNTER — Encounter (HOSPITAL_BASED_OUTPATIENT_CLINIC_OR_DEPARTMENT_OTHER): Payer: Self-pay | Admitting: Plastic Surgery

## 2023-07-05 LAB — SURGICAL PATHOLOGY

## 2023-07-11 ENCOUNTER — Encounter: Payer: BC Managed Care – PPO | Admitting: Student

## 2023-07-11 ENCOUNTER — Ambulatory Visit: Payer: BC Managed Care – PPO | Admitting: Student

## 2023-07-11 VITALS — BP 114/75 | HR 75

## 2023-07-11 DIAGNOSIS — R2233 Localized swelling, mass and lump, upper limb, bilateral: Secondary | ICD-10-CM

## 2023-07-11 NOTE — Progress Notes (Signed)
Patient is a 43 year old male who recently underwent excision of bilateral arm masses/lipomas with Dr. Ulice Bold on 07/04/2023.  He had 2 masses removed from the left arm and 1 mass removed from the right arm.  He is 1 week out from his procedure and presents to the clinic today for postoperative follow-up.  Specimens were sent to pathology, both were consistent with lipoma, negative for malignancy.  Today, patient reports he is doing well.  He denies any issues or concerns with his procedure site.  Denies any fevers or chills.  Denies any drainage.  On exam, patient is sitting upright in no acute distress.  Incisions are intact with Steri-Strips.  There is no surrounding erythema or drainage.  No swelling.  No signs of infection.  Steri-Strips were removed to the incisions bilaterally.  To the right arm, incision is intact and healing well.  To the left arm, there is a small wound dehiscence noted to the inferior aspect of the incision.  No drainage on exam.  Remainder of the incision is intact.  Recommended that patient apply Vaseline doings incision daily to the left arm and cover with a bandage.  Recommended he continue with lifting restrictions.  Patient expressed understanding.  Patient to follow back up next week.  Instructed him to call in the meantime if he has any questions or concerns.  Pictures were obtained of the patient and placed in the chart with the patient's or guardian's permission.

## 2023-07-18 ENCOUNTER — Ambulatory Visit: Payer: BC Managed Care – PPO | Admitting: Student

## 2023-07-18 VITALS — BP 122/67 | HR 77

## 2023-07-18 DIAGNOSIS — R2233 Localized swelling, mass and lump, upper limb, bilateral: Secondary | ICD-10-CM

## 2023-07-18 NOTE — Progress Notes (Signed)
Patient is a 43 year old male who recently underwent excision of bilateral arm masses/lipomas with Dr. Ulice Bold on 07/04/2023.  He had 2 masses removed from his left arm and 1 mass removed from his right arm.  Patient is 2 weeks postop.  He presents to the clinic today for postoperative follow-up.  Patient was last seen in the clinic on 07/11/2023.  At this visit, patient was doing well.  On exam, the right arm incision was intact and healing well.  To the left arm, there is a small wound dehiscence noted to the inferior aspect of the incision.  Recommended he apply Vaseline to his left arm daily.  Today, patient reports he is doing well.  He states he has been applying Vaseline over his incision is bilaterally.  He reports there is still little bit Dermabond to the right arm incision.  Denies any drainage from his incisions.  Denies any fevers or chills.  On exam, patient is sitting upright in no acute distress.  Right arm incision appears to be intact and well-healed.  There is still some residual Dermabond overlying the incision.  No surrounding erythema or drainage.  There was a suture knot that was cut and removed.  Patient tolerated well.  To the left arm incision, it appears the small area of dehiscence has almost completely healed.  There appears to be a little bit of irritation around the incision.  It is nontender to palpation, there is no fluid collections palpated.  No drainage on exam.  A small suture knot was cut and removed.  Patient tolerated well.  There are no overt signs of infection on exam.  Recommended that patient apply Vaseline to his incisions for the next 2 weeks.  Discussed with him after that point, once all of the Dermabond has come off his incisions, he may start using scar creams if he would like.  Did discuss with the patient that sunlight can worsen the scar, recommended if he is going to be out in the sun to cover or to apply sunscreen.  Patient expressed  understanding.  Discussed with patient he may start gradually increasing his activities next week.  Patient to follow-up as needed.  Instructed him to call if he has any questions or concerns about anything.

## 2023-07-20 ENCOUNTER — Ambulatory Visit (INDEPENDENT_AMBULATORY_CARE_PROVIDER_SITE_OTHER): Payer: BC Managed Care – PPO | Admitting: Surgical

## 2023-07-20 DIAGNOSIS — R2233 Localized swelling, mass and lump, upper limb, bilateral: Secondary | ICD-10-CM

## 2023-07-20 NOTE — Progress Notes (Signed)
43 year old male here for follow-up after removal of multiple arm masses.  He did have 1 removed on his right arm and he feels as if there is a suture still present there.  On exam right arm incision overall appears intact, he does have a suture at the proximal portion of the incision.  Appears to be a Monocryl suture.  There is no surrounding cellulitic changes.  There is some irritation and redness along the incision line and where the sutures were removed.  There is a slight opening that is less than 1 mm at the portion where the suture is in place.  A/P:  Patient is overall doing well after excision of arm masses, Monocryl suture from the right forearm was removed.  Patient tolerated this well.  Recommend keeping this covered with Vaseline until it completely heals.  There is no signs of infection or concern on exam.  Discussed the importance of sunscreen event hyper pigmentation.  Recommend following up as needed.  Call with questions or concerns.

## 2023-07-25 ENCOUNTER — Encounter: Payer: BC Managed Care – PPO | Admitting: Student

## 2023-08-08 ENCOUNTER — Other Ambulatory Visit: Payer: Self-pay

## 2023-08-08 ENCOUNTER — Other Ambulatory Visit (HOSPITAL_COMMUNITY): Payer: Self-pay

## 2023-08-08 ENCOUNTER — Encounter: Payer: BC Managed Care – PPO | Admitting: Student

## 2023-08-08 ENCOUNTER — Other Ambulatory Visit: Payer: Self-pay | Admitting: Pharmacist

## 2023-08-08 ENCOUNTER — Ambulatory Visit (INDEPENDENT_AMBULATORY_CARE_PROVIDER_SITE_OTHER): Payer: BC Managed Care – PPO | Admitting: Student

## 2023-08-08 VITALS — BP 104/71 | HR 73

## 2023-08-08 DIAGNOSIS — R2233 Localized swelling, mass and lump, upper limb, bilateral: Secondary | ICD-10-CM

## 2023-08-08 DIAGNOSIS — Z2981 Encounter for HIV pre-exposure prophylaxis: Secondary | ICD-10-CM

## 2023-08-08 MED ORDER — APRETUDE 600 MG/3ML IM SUER
600.0000 mg | INTRAMUSCULAR | 5 refills | Status: DC
  Filled 2023-08-08 (×2): qty 3, 60d supply, fill #0
  Filled 2023-10-01: qty 3, 60d supply, fill #1
  Filled 2023-12-03: qty 3, 60d supply, fill #2
  Filled 2024-02-01: qty 3, 60d supply, fill #3
  Filled 2024-04-04: qty 3, 60d supply, fill #4
  Filled 2024-05-27: qty 3, 60d supply, fill #5

## 2023-08-08 NOTE — Progress Notes (Signed)
Specialty Pharmacy Initial Fill Coordination Note  Michael Duncan is a 43 y.o. male contacted today regarding initial fill of specialty medication(s) Cabotegravir (Apretude)   Patient requested Courier to Provider Office   Delivery date: 08/13/23   Verified address: [address] [place] [address]   Medication will be filled on 08/10/23.   Patient is aware of 0.00 copayment.

## 2023-08-08 NOTE — Progress Notes (Signed)
Patient is a 43 year old male who recently underwent excision of bilateral arm masses/lipomas with Dr. Ulice Bold on 07/04/2023.  He is 5 weeks postop.  He presents to the clinic today for postoperative follow-up.     Patient was most recently seen in the clinic on 07/20/2023.  At this visit, patient reported that there is a suture present in his right arm.  There was a bit of irritation and redness along the incision line and where the sutures were removed.  The suture was removed.  Plan is for patient to keep the area covered with Vaseline until it completely heals.  Today, patient reports he is doing well.  He denies any issues or concerns with the surgical sites.  Does not report any infectious symptoms.  Reports he has been applying Vaseline to his incisions.   On exam, patient is sitting upright in no acute distress.  To the right upper extremity, incision is completely healed.  There is a little bit of firmness noted to the proximal end of the incision consistent with a little bit of scar tissue.  There is no surrounding erythema, swelling or drainage.  To the left upper extremity, incision is well-healed.  No surrounding erythema, drainage or swelling.  There are no signs of infection on exam.  Discussed with the patient that he may transition to scar creams at this time.  We discussed different scar creams such as silicone scar tapes, silicone-based scar creams and Mederma.  We also discussed avoiding direct sunlight as this can worsen the scar.  Patient expressed understanding.  Also recommended that the patient gently massage the area of firmness.  Patient expressed understanding.  Patient to follow-up as needed.  Instructed him to call if he has any questions or concerns about anything.

## 2023-08-10 ENCOUNTER — Other Ambulatory Visit: Payer: Self-pay

## 2023-08-13 ENCOUNTER — Telehealth: Payer: Self-pay

## 2023-08-13 NOTE — Telephone Encounter (Signed)
[facility] Patient Advocate Encounter  Patient's medications Apretude have been couriered to [facility] from [facility] and will be administered at the patients appointment on 08/20/23.  Clearance Coots, CPhT Specialty Pharmacy Patient [facility] for [facility]ious Disease Phone: [phone] Fax:  [phone]

## 2023-08-19 NOTE — Progress Notes (Signed)
 HPI: Michael Duncan is a 43 y.o. male who presents to the [facility] pharmacy clinic for Apretude administration and HIV PrEP follow up.  Insured   [x]    Uninsured  []    Patient Active Problem List   Diagnosis Date Noted   Mass of arm, bilateral 03/02/2023   Mass of leg, right 03/02/2023   Male erectile disorder (CODE) 05/27/2021   Male infertility 05/27/2021   Tension type headache 05/27/2021   Chronic pansinusitis 12/03/2017   Deviated septum 12/03/2017   Hypertrophy, nasal, turbinate 12/03/2017    Patient's Medications  New Prescriptions   No medications on file  Previous Medications   CABOTEGRAVIR ER (APRETUDE) 600 MG/3ML INJECTION    Inject 3 mLs (600 mg total) into the muscle every 2 (two) months.   ONDANSETRON (ZOFRAN) 4 MG TABLET    Take 1 tablet (4 mg total) by mouth every 8 (eight) hours as needed for up to 10 doses for nausea or vomiting.   OXYCODONE (ROXICODONE) 5 MG IMMEDIATE RELEASE TABLET    Take 1 tablet (5 mg total) by mouth every 6 (six) hours as needed for up to 8 doses for severe pain (pain score 7-10).  Modified Medications   No medications on file  Discontinued Medications   No medications on file    Allergies: Allergies  Allergen Reactions   Doxycycline Other (See Comments)    Fixed Drug Reaction     Labs: Lab Results  Component Value Date   HIV1RNAQUANT Not Detected 06/18/2023   HIV1RNAQUANT Not Detected 04/17/2023   HIV1RNAQUANT Not Detected 02/14/2023    RPR and STI Lab Results  Component Value Date   LABRPR NON-REACTIVE 06/18/2023   LABRPR NON-REACTIVE 04/17/2023   LABRPR NON-REACTIVE 02/14/2023   LABRPR NON-REACTIVE 10/11/2022   LABRPR NON-REACTIVE 06/12/2022    STI Results GC GC CT CT  04/17/2023  1:42 PM Negative    Negative    Negative   Negative    Negative    Negative    02/14/2023  1:59 PM Negative    Negative    Negative   Negative    Negative    Negative    10/11/2022  2:52 PM Negative    Negative    Negative    Negative    Negative    Positive    06/12/2022  2:39 PM Negative   Negative    10/19/2021 11:15 AM Negative   Negative    06/13/2021 11:22 AM Negative   Negative    06/13/2021 11:12 AM Negative    Negative   Negative    Negative    02/16/2021 10:59 AM Negative   Negative    10/12/2016  9:35 AM  NOT DETECTED   NOT DETECTED     Hepatitis B Lab Results  Component Value Date   HEPBSAB POS (A) 10/12/2016   HEPBSAG NEGATIVE 10/04/2016   HEPBCAB NON REACTIVE 10/12/2016   Hepatitis C Lab Results  Component Value Date   HEPCAB NON-REACTIVE 12/09/2020   Hepatitis A Lab Results  Component Value Date   HAV REACTIVE (A) 10/11/2022   Lipids: Lab Results  Component Value Date   CHOL 140 10/16/2018   TRIG 76 10/16/2018   HDL 43 10/16/2018   CHOLHDL 3.3 10/16/2018   VLDL 13 10/12/2016   LDLCALC 81 10/16/2018    TARGET DATE: 24th  Assessment: Cordaryl presents today for 2 month maintenance Apretude injection and to follow up for HIV PrEP. Last seen by [name] in December for  Apretude injection. No issues with past injections. Denies any symptoms of acute HIV. Last STI screening was in December and was negative. No known exposures to any STIs or new partners since last visit. Agrees to STI testing today. Will check RPR and oral, rectal, urine cytologies today.   Per [employer] guidelines, a rapid HIV test should be drawn prior to Apretude administration. Due to state shortage of rapid HIV tests, this is temporarily unable to be done. Per decision from [facility] physicians, we will proceed with Apretude administration at this time without a negative rapid HIV test beforehand. HIV RNA was collected today and is in process.  Administered cabotegravir 600mg /85mL in left upper outer quadrant of the gluteal muscle. Will see Michaeljoseph back in 2 months for injection, labs, and HIV PrEP follow up.  Currently up to date on vaccinations. Next appointment already scheduled with [name] for April 22 at 1:30PM  and would like to wait until then before scheduling further follow up appointments.  Plan: - Apretude injection administered - HIV RNA today - RPR, oral, urine, rectal cytologies ordered - Next injection, labs, and PrEP follow up appointment scheduled for 10/16/23 with [name] - Call with any issues or questions  Stephenie Acres, PharmD PGY1 Pharmacy Resident 08/19/2023 7:32 PM

## 2023-08-20 ENCOUNTER — Other Ambulatory Visit: Payer: Self-pay

## 2023-08-20 ENCOUNTER — Ambulatory Visit (INDEPENDENT_AMBULATORY_CARE_PROVIDER_SITE_OTHER): Payer: BC Managed Care – PPO | Admitting: Pharmacist

## 2023-08-20 DIAGNOSIS — Z113 Encounter for screening for infections with a predominantly sexual mode of transmission: Secondary | ICD-10-CM

## 2023-08-20 DIAGNOSIS — Z2981 Encounter for HIV pre-exposure prophylaxis: Secondary | ICD-10-CM

## 2023-08-20 MED ORDER — CABOTEGRAVIR ER 600 MG/3ML IM SUER
600.0000 mg | Freq: Once | INTRAMUSCULAR | Status: AC
  Administered 2023-08-20: 600 mg via INTRAMUSCULAR

## 2023-08-21 LAB — C. TRACHOMATIS/N. GONORRHOEAE RNA
C. trachomatis RNA, TMA: NOT DETECTED
N. gonorrhoeae RNA, TMA: NOT DETECTED

## 2023-08-21 LAB — GC/CHLAMYDIA PROBE, AMP (THROAT)
Chlamydia trachomatis RNA: NOT DETECTED
Neisseria gonorrhoeae RNA: NOT DETECTED

## 2023-08-21 LAB — CT/NG RNA, TMA RECTAL
Chlamydia Trachomatis RNA: NOT DETECTED
Neisseria Gonorrhoeae RNA: NOT DETECTED

## 2023-08-23 LAB — HIV-1 RNA QUANT-NO REFLEX-BLD
HIV 1 RNA Quant: NOT DETECTED {copies}/mL
HIV-1 RNA Quant, Log: NOT DETECTED {Log}

## 2023-08-23 LAB — RPR: RPR Ser Ql: NONREACTIVE

## 2023-10-01 ENCOUNTER — Other Ambulatory Visit (HOSPITAL_COMMUNITY): Payer: Self-pay

## 2023-10-01 ENCOUNTER — Other Ambulatory Visit: Payer: Self-pay

## 2023-10-01 NOTE — Progress Notes (Signed)
 Specialty Pharmacy Refill Coordination Note  Michael Duncan is a 43 y.o. male assessed today regarding refills of clinic administered specialty medication(s) Cabotegravir (Apretude)   Clinic requested Courier to Provider Office   Delivery date: 10/11/23   Verified address: [address] [place] [address]   Medication will be filled on 10/10/23.

## 2023-10-10 ENCOUNTER — Other Ambulatory Visit: Payer: Self-pay

## 2023-10-11 ENCOUNTER — Telehealth: Payer: Self-pay

## 2023-10-11 NOTE — Telephone Encounter (Signed)
 [facility] Patient Advocate Encounter  Patient's medications APRETUDE have been couriered to [facility] from [facility] and will be administered at the patients appointment on 10/16/23.  Michael Duncan, CPhT Specialty Pharmacy Patient [name] for Infectious Disease Phone: [phone] Fax:  [phone]

## 2023-10-15 NOTE — Progress Notes (Signed)
 HPI: Michael Duncan is a 43 y.o. male who presents to the [facility] pharmacy clinic for Apretude  administration and HIV PrEP follow up.  Insured   [x]    Uninsured  []    Patient Active Problem List   Diagnosis Date Noted   Mass of arm, bilateral 03/02/2023   Mass of leg, right 03/02/2023   Male erectile disorder (CODE) 05/27/2021   Male infertility 05/27/2021   Tension type headache 05/27/2021   Chronic pansinusitis 12/03/2017   Deviated septum 12/03/2017   Hypertrophy, nasal, turbinate 12/03/2017    Patient's Medications  New Prescriptions   No medications on file  Previous Medications   CABOTEGRAVIR  ER (APRETUDE ) 600 MG/3ML INJECTION    Inject 3 mLs (600 mg total) into the muscle every 2 (two) months.   ONDANSETRON  (ZOFRAN ) 4 MG TABLET    Take 1 tablet (4 mg total) by mouth every 8 (eight) hours as needed for up to 10 doses for nausea or vomiting.   OXYCODONE  (ROXICODONE ) 5 MG IMMEDIATE RELEASE TABLET    Take 1 tablet (5 mg total) by mouth every 6 (six) hours as needed for up to 8 doses for severe pain (pain score 7-10).  Modified Medications   No medications on file  Discontinued Medications   No medications on file    Allergies: Allergies  Allergen Reactions   Doxycycline  Other (See Comments)    Fixed Drug Reaction     Labs: Lab Results  Component Value Date   HIV1RNAQUANT Not Detected 08/20/2023   HIV1RNAQUANT Not Detected 06/18/2023   HIV1RNAQUANT Not Detected 04/17/2023    RPR and STI Lab Results  Component Value Date   LABRPR NON-REACTIVE 08/20/2023   LABRPR NON-REACTIVE 06/18/2023   LABRPR NON-REACTIVE 04/17/2023   LABRPR NON-REACTIVE 02/14/2023   LABRPR NON-REACTIVE 10/11/2022    STI Results GC GC CT CT  04/17/2023  1:42 PM Negative    Negative    Negative   Negative    Negative    Negative    02/14/2023  1:59 PM Negative    Negative    Negative   Negative    Negative    Negative    10/11/2022  2:52 PM Negative    Negative    Negative    Negative    Negative    Positive    06/12/2022  2:39 PM Negative   Negative    10/19/2021 11:15 AM Negative   Negative    06/13/2021 11:22 AM Negative   Negative    06/13/2021 11:12 AM Negative    Negative   Negative    Negative    02/16/2021 10:59 AM Negative   Negative    10/12/2016  9:35 AM  NOT DETECTED   NOT DETECTED     Hepatitis B Lab Results  Component Value Date   HEPBSAB POS (A) 10/12/2016   HEPBSAG NEGATIVE 10/04/2016   HEPBCAB NON REACTIVE 10/12/2016   Hepatitis C Lab Results  Component Value Date   HEPCAB NON-REACTIVE 12/09/2020   Hepatitis A Lab Results  Component Value Date   HAV REACTIVE (A) 10/11/2022   Lipids: Lab Results  Component Value Date   CHOL 140 10/16/2018   TRIG 76 10/16/2018   HDL 43 10/16/2018   CHOLHDL 3.3 10/16/2018   VLDL 13 10/12/2016   LDLCALC 81 10/16/2018    TARGET DATE: 24  Assessment: Michael Duncan presents today for 2 mo Apretude  injection and to follow up for HIV PrEP. No issues with past injections. Denies any symptoms  of acute HIV. Last STI screening was in February 2025 and was negative. No known exposures to any STIs since last visit. Agrees to STI testing today.    Per [employer] guidelines, a rapid HIV test should be drawn prior to Apretude  administration. Due to state shortage of rapid HIV tests, this is temporarily unable to be done. Per decision from [facility] physicians, we will proceed with Apretude  administration at this time without a negative rapid HIV test beforehand. HIV RNA was collected today and is in process.  Administered cabotegravir  600mg /62mL in RIGHT upper outer quadrant of the gluteal muscle. Will see Michael Duncan back in 2 months for injection, labs, and HIV PrEP follow up.  Up to date on vaccinations.   Plan: - Apretude  injection administered - HIV RNA today - RPR, oral, urine, rectal cytologies ordered  - Next injections, labs, and PrEP follow up appointment scheduled for June 17, August 18 - Call with any  issues or questions  Kristopher Pheasant PharmD Candidate

## 2023-10-16 ENCOUNTER — Other Ambulatory Visit: Payer: Self-pay

## 2023-10-16 ENCOUNTER — Ambulatory Visit (INDEPENDENT_AMBULATORY_CARE_PROVIDER_SITE_OTHER): Payer: BC Managed Care – PPO | Admitting: Pharmacist

## 2023-10-16 DIAGNOSIS — Z2981 Encounter for HIV pre-exposure prophylaxis: Secondary | ICD-10-CM | POA: Diagnosis not present

## 2023-10-16 DIAGNOSIS — Z113 Encounter for screening for infections with a predominantly sexual mode of transmission: Secondary | ICD-10-CM | POA: Diagnosis not present

## 2023-10-16 MED ORDER — CABOTEGRAVIR ER 600 MG/3ML IM SUER
600.0000 mg | Freq: Once | INTRAMUSCULAR | Status: AC
  Administered 2023-10-16: 600 mg via INTRAMUSCULAR

## 2023-10-17 LAB — GC/CHLAMYDIA PROBE, AMP (THROAT)
Chlamydia trachomatis RNA: NOT DETECTED
Neisseria gonorrhoeae RNA: NOT DETECTED

## 2023-10-17 LAB — C. TRACHOMATIS/N. GONORRHOEAE RNA
C. trachomatis RNA, TMA: NOT DETECTED
N. gonorrhoeae RNA, TMA: NOT DETECTED

## 2023-10-17 LAB — CT/NG RNA, TMA RECTAL
Chlamydia Trachomatis RNA: NOT DETECTED
Neisseria Gonorrhoeae RNA: NOT DETECTED

## 2023-10-18 LAB — RPR: RPR Ser Ql: NONREACTIVE

## 2023-10-18 LAB — HIV-1 RNA QUANT-NO REFLEX-BLD
HIV 1 RNA Quant: NOT DETECTED {copies}/mL
HIV-1 RNA Quant, Log: NOT DETECTED {Log_copies}/mL

## 2023-12-03 ENCOUNTER — Other Ambulatory Visit: Payer: Self-pay

## 2023-12-03 ENCOUNTER — Other Ambulatory Visit (HOSPITAL_COMMUNITY): Payer: Self-pay

## 2023-12-03 NOTE — Progress Notes (Signed)
 Specialty Pharmacy Refill Coordination Note  Michael Duncan is a 43 y.o. male assessed today regarding refills of clinic administered specialty medication(s) Cabotegravir  (Apretude )   Clinic requested Courier to Provider Office   Delivery date: 12/06/23   Verified address: [address] [place] [address]   Medication will be filled on 12/05/23.

## 2023-12-05 ENCOUNTER — Other Ambulatory Visit: Payer: Self-pay

## 2023-12-06 ENCOUNTER — Telehealth: Payer: Self-pay

## 2023-12-06 DIAGNOSIS — D179 Benign lipomatous neoplasm, unspecified: Secondary | ICD-10-CM | POA: Diagnosis not present

## 2023-12-06 DIAGNOSIS — Z0000 Encounter for general adult medical examination without abnormal findings: Secondary | ICD-10-CM | POA: Diagnosis not present

## 2023-12-06 NOTE — Telephone Encounter (Signed)
 [facility] Patient Advocate Encounter  Patient's medications APRETUDE  have been couriered to [facility] from [facility] and will be administered at the patients appointment on 12/11/23.  Verline Glow, CPhT Specialty Pharmacy Patient [facility] for Infectious Disease Phone: [phone] Fax:  [phone]

## 2023-12-09 NOTE — Progress Notes (Unsigned)
 HPI: Michael Duncan is a 43 y.o. male who presents to the [facility] pharmacy clinic for Apretude  administration and HIV PrEP follow up.  Insured   [x]    Uninsured  []    Patient Active Problem List   Diagnosis Date Noted   Mass of arm, bilateral 03/02/2023   Mass of leg, right 03/02/2023   Male erectile disorder (CODE) 05/27/2021   Male infertility 05/27/2021   Tension type headache 05/27/2021   Chronic pansinusitis 12/03/2017   Deviated septum 12/03/2017   Hypertrophy, nasal, turbinate 12/03/2017    Patient's Medications  New Prescriptions   No medications on file  Previous Medications   CABOTEGRAVIR  ER (APRETUDE ) 600 MG/3ML INJECTION    Inject 3 mLs (600 mg total) into the muscle every 2 (two) months.   ONDANSETRON  (ZOFRAN ) 4 MG TABLET    Take 1 tablet (4 mg total) by mouth every 8 (eight) hours as needed for up to 10 doses for nausea or vomiting.   OXYCODONE  (ROXICODONE ) 5 MG IMMEDIATE RELEASE TABLET    Take 1 tablet (5 mg total) by mouth every 6 (six) hours as needed for up to 8 doses for severe pain (pain score 7-10).  Modified Medications   No medications on file  Discontinued Medications   No medications on file    Allergies: Allergies  Allergen Reactions   Doxycycline  Other (See Comments)    Fixed Drug Reaction     Labs: Lab Results  Component Value Date   HIV1RNAQUANT NOT DETECTED 10/16/2023   HIV1RNAQUANT Not Detected 08/20/2023   HIV1RNAQUANT Not Detected 06/18/2023    RPR and STI Lab Results  Component Value Date   LABRPR NON-REACTIVE 10/16/2023   LABRPR NON-REACTIVE 08/20/2023   LABRPR NON-REACTIVE 06/18/2023   LABRPR NON-REACTIVE 04/17/2023   LABRPR NON-REACTIVE 02/14/2023    STI Results GC GC CT CT  04/17/2023  1:42 PM Negative    Negative    Negative   Negative    Negative    Negative    02/14/2023  1:59 PM Negative    Negative    Negative   Negative    Negative    Negative    10/11/2022  2:52 PM Negative    Negative    Negative    Negative    Negative    Positive    06/12/2022  2:39 PM Negative   Negative    10/19/2021 11:15 AM Negative   Negative    06/13/2021 11:22 AM Negative   Negative    06/13/2021 11:12 AM Negative    Negative   Negative    Negative    02/16/2021 10:59 AM Negative   Negative    10/12/2016  9:35 AM  NOT DETECTED   NOT DETECTED     Hepatitis B Lab Results  Component Value Date   HEPBSAB POS (A) 10/12/2016   HEPBSAG NEGATIVE 10/04/2016   HEPBCAB NON REACTIVE 10/12/2016   Hepatitis C Lab Results  Component Value Date   HEPCAB NON-REACTIVE 12/09/2020   Hepatitis A Lab Results  Component Value Date   HAV REACTIVE (A) 10/11/2022   Lipids: Lab Results  Component Value Date   CHOL 140 10/16/2018   TRIG 76 10/16/2018   HDL 43 10/16/2018   CHOLHDL 3.3 10/16/2018   VLDL 13 10/12/2016   LDLCALC 81 10/16/2018    TARGET DATE: 24th  Assessment: Michael Duncan presents today for his Apretude  injection and to follow up for HIV PrEP. No issues with past injections. Denies any symptoms of  acute HIV. Last STI screening was on 10/16/2023 and was negative. No known exposures to any STIs since last visit. He agrees to STI testing today.    Immunizations are up to date.   Per [employer] guidelines, a rapid HIV test should be drawn prior to Apretude  administration. Due to state shortage of rapid HIV tests, this is temporarily unable to be done. Per decision from [facility] physicians, we will proceed with Apretude  administration at this time without a negative rapid HIV test beforehand. HIV RNA was collected today and is in process.  Administered cabotegravir  600mg /25mL in LEFT upper outer quadrant of the gluteal muscle. Will see Michael Duncan back in 2 months for injection, labs, and HIV PrEP follow up.  Plan: - Apretude  injection administered - HIV RNA today - Urine/Rectal/Oral Cytologies and RPR collected - Next injection, labs, and PrEP follow up appointment scheduled for 08/18 - Call with any issues or  questions  Michael Duncan, PharmD [facility] PGY-1

## 2023-12-11 ENCOUNTER — Ambulatory Visit (INDEPENDENT_AMBULATORY_CARE_PROVIDER_SITE_OTHER): Admitting: Pharmacist

## 2023-12-11 ENCOUNTER — Other Ambulatory Visit: Payer: Self-pay

## 2023-12-11 DIAGNOSIS — Z113 Encounter for screening for infections with a predominantly sexual mode of transmission: Secondary | ICD-10-CM | POA: Diagnosis not present

## 2023-12-11 DIAGNOSIS — Z2981 Encounter for HIV pre-exposure prophylaxis: Secondary | ICD-10-CM

## 2023-12-11 MED ORDER — CABOTEGRAVIR ER 600 MG/3ML IM SUER
600.0000 mg | Freq: Once | INTRAMUSCULAR | Status: AC
  Administered 2023-12-11: 600 mg via INTRAMUSCULAR

## 2023-12-12 LAB — CT/NG RNA, TMA RECTAL
Chlamydia Trachomatis RNA: NOT DETECTED
Neisseria Gonorrhoeae RNA: NOT DETECTED

## 2023-12-12 LAB — C. TRACHOMATIS/N. GONORRHOEAE RNA
C. trachomatis RNA, TMA: NOT DETECTED
N. gonorrhoeae RNA, TMA: NOT DETECTED

## 2023-12-12 LAB — GC/CHLAMYDIA PROBE, AMP (THROAT)
Chlamydia trachomatis RNA: NOT DETECTED
Neisseria gonorrhoeae RNA: NOT DETECTED

## 2023-12-13 LAB — RPR: RPR Ser Ql: NONREACTIVE

## 2023-12-13 LAB — HIV-1 RNA QUANT-NO REFLEX-BLD
HIV 1 RNA Quant: NOT DETECTED {copies}/mL
HIV-1 RNA Quant, Log: NOT DETECTED {Log_copies}/mL

## 2024-02-01 ENCOUNTER — Other Ambulatory Visit (HOSPITAL_COMMUNITY): Payer: Self-pay

## 2024-02-01 ENCOUNTER — Other Ambulatory Visit: Payer: Self-pay

## 2024-02-01 NOTE — Progress Notes (Signed)
 Specialty Pharmacy Refill Coordination Note  Michael Duncan is a 43 y.o. male assessed today regarding refills of clinic administered specialty medication(s) Cabotegravir  (Apretude )   Clinic requested Courier to Provider Office   Delivery date: 02/07/24   Verified address: [address] [place] [address]   Medication will be filled on 02/06/24.

## 2024-02-06 ENCOUNTER — Other Ambulatory Visit: Payer: Self-pay

## 2024-02-07 ENCOUNTER — Telehealth: Payer: Self-pay

## 2024-02-07 NOTE — Telephone Encounter (Signed)
 [facility] Patient Advocate Encounter  Patient's medications Apretude  have been couriered to [facility] from [facility] and will be administered at the patients appointment on 02/12/24.  Arland Hutchinson, CPhT Specialty Pharmacy Patient [facility] for Infectious Disease Phone: [phone] Fax:  [phone]

## 2024-02-11 ENCOUNTER — Other Ambulatory Visit (HOSPITAL_COMMUNITY)
Admission: RE | Admit: 2024-02-11 | Discharge: 2024-02-11 | Disposition: A | Discharge status: Home / Self Care | Source: Ambulatory Visit | Attending: Infectious Disease | Admitting: Infectious Disease

## 2024-02-11 ENCOUNTER — Ambulatory Visit: Admitting: Pharmacist

## 2024-02-11 ENCOUNTER — Other Ambulatory Visit: Payer: Self-pay

## 2024-02-11 DIAGNOSIS — Z113 Encounter for screening for infections with a predominantly sexual mode of transmission: Secondary | ICD-10-CM | POA: Diagnosis not present

## 2024-02-11 DIAGNOSIS — Z2981 Encounter for HIV pre-exposure prophylaxis: Secondary | ICD-10-CM | POA: Diagnosis not present

## 2024-02-11 MED ORDER — CABOTEGRAVIR ER 600 MG/3ML IM SUER
600.0000 mg | Freq: Once | INTRAMUSCULAR | Status: AC
  Administered 2024-02-11: 600 mg via INTRAMUSCULAR

## 2024-02-11 NOTE — Progress Notes (Signed)
 HPI: James Senn is a 43 y.o. male who presents to the [facility] pharmacy clinic for Apretude  administration and HIV PrEP follow up.  Insured   [x]    Uninsured  []    Patient Active Problem List   Diagnosis Date Noted   Mass of arm, bilateral 03/02/2023   Mass of leg, right 03/02/2023   Male erectile disorder (CODE) 05/27/2021   Male infertility 05/27/2021   Tension type headache 05/27/2021   Chronic pansinusitis 12/03/2017   Deviated septum 12/03/2017   Hypertrophy, nasal, turbinate 12/03/2017    Patient's Medications  New Prescriptions   No medications on file  Previous Medications   CABOTEGRAVIR  ER (APRETUDE ) 600 MG/3ML INJECTION    Inject 3 mLs (600 mg total) into the muscle every 2 (two) months.   ONDANSETRON  (ZOFRAN ) 4 MG TABLET    Take 1 tablet (4 mg total) by mouth every 8 (eight) hours as needed for up to 10 doses for nausea or vomiting.   OXYCODONE  (ROXICODONE ) 5 MG IMMEDIATE RELEASE TABLET    Take 1 tablet (5 mg total) by mouth every 6 (six) hours as needed for up to 8 doses for severe pain (pain score 7-10).  Modified Medications   No medications on file  Discontinued Medications   No medications on file    Allergies: Allergies  Allergen Reactions   Doxycycline  Other (See Comments)    Fixed Drug Reaction     Labs: Lab Results  Component Value Date   HIV1RNAQUANT NOT DETECTED 12/11/2023   HIV1RNAQUANT NOT DETECTED 10/16/2023   HIV1RNAQUANT Not Detected 08/20/2023    RPR and STI Lab Results  Component Value Date   LABRPR NON-REACTIVE 12/11/2023   LABRPR NON-REACTIVE 10/16/2023   LABRPR NON-REACTIVE 08/20/2023   LABRPR NON-REACTIVE 06/18/2023   LABRPR NON-REACTIVE 04/17/2023    STI Results GC GC CT CT  04/17/2023  1:42 PM Negative    Negative    Negative   Negative    Negative    Negative    02/14/2023  1:59 PM Negative    Negative    Negative   Negative    Negative    Negative    10/11/2022  2:52 PM Negative    Negative    Negative    Negative    Negative    Positive    06/12/2022  2:39 PM Negative   Negative    10/19/2021 11:15 AM Negative   Negative    06/13/2021 11:22 AM Negative   Negative    06/13/2021 11:12 AM Negative    Negative   Negative    Negative    02/16/2021 10:59 AM Negative   Negative    10/12/2016  9:35 AM  NOT DETECTED   NOT DETECTED     Hepatitis B Lab Results  Component Value Date   HEPBSAB POS (A) 10/12/2016   HEPBSAG NEGATIVE 10/04/2016   HEPBCAB NON REACTIVE 10/12/2016   Hepatitis C Lab Results  Component Value Date   HEPCAB NON-REACTIVE 12/09/2020   Hepatitis A Lab Results  Component Value Date   HAV REACTIVE (A) 10/11/2022   Lipids: Lab Results  Component Value Date   CHOL 140 10/16/2018   TRIG 76 10/16/2018   HDL 43 10/16/2018   CHOLHDL 3.3 10/16/2018   VLDL 13 10/12/2016   LDLCALC 81 10/16/2018    TARGET DATE: The 24th  Assessment: Shah presents today for his Apretude  injection and to follow up for HIV PrEP. No issues with past injections. Denies any symptoms  of acute HIV. Last HIV RNA was negative on 12/11/23.  Last STI screening was also on 12/11/23 and was negative. No known exposures to any STIs since last visit. Agrees to STI testing today.    Per [employer] guidelines, a rapid HIV test should be drawn prior to Apretude  administration. Due to state shortage of rapid HIV tests, this is temporarily unable to be done. Per decision from [facility] physicians, we will proceed with Apretude  administration at this time without a negative rapid HIV test beforehand. HIV RNA was collected today and is in process.  Administered cabotegravir  600mg /15mL in left upper outer quadrant of the gluteal muscle. Will see him back in 2 months for injection, labs, and HIV PrEP follow up.  Plan: - Apretude  injection administered - HIV RNA, RPR, and urine/rectal/pharyngeal cytologies for GC/chlamydia today - Next injection, labs, and PrEP follow up appointment scheduled for 04/15/24 -  Call with any issues or questions  Shondell Fabel L. Jamilette Suchocki, PharmD, BCIDP, AAHIVP, CPP Clinical Pharmacist Practitioner - Infectious Diseases Clinical Pharmacist Lead - Specialty Pharmacy [facility] for [facility]

## 2024-02-12 LAB — URINE CYTOLOGY ANCILLARY ONLY
Chlamydia: NEGATIVE
Comment: NEGATIVE
Comment: NORMAL
Neisseria Gonorrhea: NEGATIVE

## 2024-02-12 LAB — CYTOLOGY, (ORAL, ANAL, URETHRAL) ANCILLARY ONLY
Chlamydia: NEGATIVE
Chlamydia: NEGATIVE
Comment: NEGATIVE
Comment: NEGATIVE
Comment: NORMAL
Comment: NORMAL
Neisseria Gonorrhea: NEGATIVE
Neisseria Gonorrhea: NEGATIVE

## 2024-02-13 LAB — HIV-1 RNA QUANT-NO REFLEX-BLD
HIV 1 RNA Quant: NOT DETECTED {copies}/mL
HIV-1 RNA Quant, Log: NOT DETECTED {Log_copies}/mL

## 2024-02-13 LAB — RPR: RPR Ser Ql: NONREACTIVE

## 2024-04-04 ENCOUNTER — Other Ambulatory Visit (HOSPITAL_COMMUNITY): Payer: Self-pay

## 2024-04-04 ENCOUNTER — Other Ambulatory Visit: Payer: Self-pay

## 2024-04-04 NOTE — Progress Notes (Signed)
 Specialty Pharmacy Refill Coordination Note  Michael Duncan is a 43 y.o. male assessed today regarding refills of clinic administered specialty medication(s) Cabotegravir  (Apretude )   Clinic requested Courier to Provider Office   Delivery date: 04/09/24   Verified address: [address] [place] [address]   Medication will be filled on 04/08/24.

## 2024-04-09 ENCOUNTER — Telehealth: Payer: Self-pay

## 2024-04-09 NOTE — Telephone Encounter (Signed)
 [facility] Patient Advocate Encounter  Patient's medications APRETUDE  have been couriered to [facility] from [facility] and will be administered at the patients appointment on 04/15/24.  Charmaine Sharps, CPhT Specialty Pharmacy Patient [facility] for Infectious Disease Phone: [phone] Fax:  [phone]

## 2024-04-10 ENCOUNTER — Other Ambulatory Visit (HOSPITAL_COMMUNITY): Payer: Self-pay

## 2024-04-14 NOTE — Progress Notes (Unsigned)
 HPI: Michael Duncan is a 43 y.o. male who presents to the [facility] pharmacy clinic for Apretude  administration and HIV PrEP follow up.  Referring ID Provider: Dr. Overton  Patient Active Problem List   Diagnosis Date Noted   Mass of arm, bilateral 03/02/2023   Mass of leg, right 03/02/2023   Male erectile disorder (CODE) 05/27/2021   Male infertility 05/27/2021   Tension type headache 05/27/2021   Chronic pansinusitis 12/03/2017   Deviated septum 12/03/2017   Hypertrophy, nasal, turbinate 12/03/2017    Patient's Medications  New Prescriptions   No medications on file  Previous Medications   CABOTEGRAVIR  ER (APRETUDE ) 600 MG/3ML INJECTION    Inject 3 mLs (600 mg total) into the muscle every 2 (two) months.   ONDANSETRON  (ZOFRAN ) 4 MG TABLET    Take 1 tablet (4 mg total) by mouth every 8 (eight) hours as needed for up to 10 doses for nausea or vomiting.   OXYCODONE  (ROXICODONE ) 5 MG IMMEDIATE RELEASE TABLET    Take 1 tablet (5 mg total) by mouth every 6 (six) hours as needed for up to 8 doses for severe pain (pain score 7-10).  Modified Medications   No medications on file  Discontinued Medications   No medications on file    Allergies: Allergies  Allergen Reactions   Doxycycline  Other (See Comments)    Fixed Drug Reaction     Labs: Lab Results  Component Value Date   HIV1RNAQUANT NOT DETECTED 02/11/2024   HIV1RNAQUANT NOT DETECTED 12/11/2023   HIV1RNAQUANT NOT DETECTED 10/16/2023    RPR and STI Lab Results  Component Value Date   LABRPR NON-REACTIVE 02/11/2024   LABRPR NON-REACTIVE 12/11/2023   LABRPR NON-REACTIVE 10/16/2023   LABRPR NON-REACTIVE 08/20/2023   LABRPR NON-REACTIVE 06/18/2023    STI Results GC GC CT CT  02/11/2024  1:55 PM Negative    Negative    Negative   Negative    Negative    Negative    04/17/2023  1:42 PM Negative    Negative    Negative   Negative    Negative    Negative    02/14/2023  1:59 PM Negative    Negative    Negative    Negative    Negative    Negative    10/11/2022  2:52 PM Negative    Negative    Negative   Negative    Negative    Positive    06/12/2022  2:39 PM Negative   Negative    10/19/2021 11:15 AM Negative   Negative    06/13/2021 11:22 AM Negative   Negative    06/13/2021 11:12 AM Negative    Negative   Negative    Negative    02/16/2021 10:59 AM Negative   Negative    10/12/2016  9:35 AM  NOT DETECTED   NOT DETECTED     Hepatitis B Lab Results  Component Value Date   HEPBSAB POS (A) 10/12/2016   HEPBSAG NEGATIVE 10/04/2016   HEPBCAB NON REACTIVE 10/12/2016   Hepatitis C Lab Results  Component Value Date   HEPCAB NON-REACTIVE 12/09/2020   Hepatitis A Lab Results  Component Value Date   HAV REACTIVE (A) 10/11/2022   Lipids: Lab Results  Component Value Date   CHOL 140 10/16/2018   TRIG 76 10/16/2018   HDL 43 10/16/2018   CHOLHDL 3.3 10/16/2018   VLDL 13 10/12/2016   LDLCALC 81 10/16/2018    Target Date: The 24th  Assessment:  Michael Duncan presents today for his Apretude  injection and to follow up for HIV PrEP. No issues with past injections. Denies any symptoms of acute HIV. Last HIV RNA was negative on 8/18.25.   Routine labs:  HIV RNA; no issues or concerns but requesting STI testing today  Eligible vaccinations:  Received annual flu and COVID vaccines on 10/8 at [facility]. Otherwise up to date on all recommended vaccines.    Apretude : Administered cabotegravir  600mg /52mL in left upper outer quadrant of the gluteal muscle. Will see him back in 2 months for next Apretude  injection, labs, and HIV PrEP follow up.  Plan: - Apretude  injection administered - HIV RNA, RPR, and urine/rectal/pharyngeal cytologies for GC/chlamydia today - Next injection, labs, and PrEP follow up appointment scheduled for 06/11/24 - Call with any issues or questions  Michael Duncan, PharmD, BCIDP, AAHIVP, CPP Clinical Pharmacist Practitioner - Infectious Diseases Clinical Pharmacist  Lead - Specialty Pharmacy [facility] for Infectious Disease

## 2024-04-15 ENCOUNTER — Ambulatory Visit (INDEPENDENT_AMBULATORY_CARE_PROVIDER_SITE_OTHER): Admitting: Pharmacist

## 2024-04-15 ENCOUNTER — Other Ambulatory Visit (HOSPITAL_COMMUNITY)
Admission: RE | Admit: 2024-04-15 | Discharge: 2024-04-15 | Disposition: A | Discharge status: Home / Self Care | Source: Ambulatory Visit | Attending: Infectious Disease | Admitting: Infectious Disease

## 2024-04-15 ENCOUNTER — Other Ambulatory Visit: Payer: Self-pay

## 2024-04-15 DIAGNOSIS — Z113 Encounter for screening for infections with a predominantly sexual mode of transmission: Secondary | ICD-10-CM | POA: Insufficient documentation

## 2024-04-15 DIAGNOSIS — Z2981 Encounter for HIV pre-exposure prophylaxis: Secondary | ICD-10-CM

## 2024-04-15 MED ORDER — CABOTEGRAVIR ER 600 MG/3ML IM SUER
600.0000 mg | Freq: Once | INTRAMUSCULAR | Status: AC
  Administered 2024-04-15: 600 mg via INTRAMUSCULAR

## 2024-04-16 LAB — CYTOLOGY, (ORAL, ANAL, URETHRAL) ANCILLARY ONLY
Chlamydia: NEGATIVE
Chlamydia: NEGATIVE
Comment: NEGATIVE
Comment: NEGATIVE
Comment: NORMAL
Comment: NORMAL
Neisseria Gonorrhea: NEGATIVE
Neisseria Gonorrhea: NEGATIVE

## 2024-04-16 LAB — URINE CYTOLOGY ANCILLARY ONLY
Chlamydia: NEGATIVE
Comment: NEGATIVE
Comment: NORMAL
Neisseria Gonorrhea: NEGATIVE

## 2024-04-17 LAB — RPR: RPR Ser Ql: NONREACTIVE

## 2024-04-17 LAB — HIV-1 RNA QUANT-NO REFLEX-BLD
HIV 1 RNA Quant: NOT DETECTED {copies}/mL
HIV-1 RNA Quant, Log: NOT DETECTED {Log_copies}/mL

## 2024-05-27 ENCOUNTER — Other Ambulatory Visit (HOSPITAL_COMMUNITY): Payer: Self-pay

## 2024-05-27 ENCOUNTER — Other Ambulatory Visit: Payer: Self-pay

## 2024-05-27 NOTE — Progress Notes (Signed)
 Patient's refill has been scheduled in [place].

## 2024-05-27 NOTE — Progress Notes (Signed)
 Specialty Pharmacy Refill Coordination Note  Michael Duncan is a 43 y.o. male assessed today regarding refills of clinic administered specialty medication(s) Cabotegravir  (Apretude )   Clinic requested Courier to Provider Office   Delivery date: 06/04/24   Verified address: [address] [place] [address]   Medication will be filled on 06/03/24.

## 2024-06-03 ENCOUNTER — Other Ambulatory Visit: Payer: Self-pay

## 2024-06-04 ENCOUNTER — Encounter: Payer: Self-pay | Admitting: Pharmacist

## 2024-06-04 ENCOUNTER — Telehealth: Payer: Self-pay

## 2024-06-04 NOTE — Telephone Encounter (Signed)
 [facility] Patient Advocate Encounter  Patient's medications Apretude  have been couriered to [facility] from [facility] and will be administered at the patients appointment on 06/11/24.  Michael Duncan, CPhT Specialty Pharmacy Patient [facility] for Infectious Disease Phone: [phone] Fax:  [phone]

## 2024-06-09 NOTE — Progress Notes (Unsigned)
 HPI: Michael Duncan is a 43 y.o. male who presents to the [facility] pharmacy clinic for Apretude  administration and HIV PrEP follow up.  Insured   [x]    Uninsured  []    Referring ID Physician: Corean Fireman, NP   Patient Active Problem List   Diagnosis Date Noted   Mass of arm, bilateral 03/02/2023   Mass of leg, right 03/02/2023   Male erectile disorder (CODE) 05/27/2021   Male infertility 05/27/2021   Tension type headache 05/27/2021   Chronic pansinusitis 12/03/2017   Deviated septum 12/03/2017   Hypertrophy, nasal, turbinate 12/03/2017    Patient's Medications  New Prescriptions   No medications on file  Previous Medications   CABOTEGRAVIR  ER (APRETUDE ) 600 MG/3ML INJECTION    Inject 3 mLs (600 mg total) into the muscle every 2 (two) months.   ONDANSETRON  (ZOFRAN ) 4 MG TABLET    Take 1 tablet (4 mg total) by mouth every 8 (eight) hours as needed for up to 10 doses for nausea or vomiting.   OXYCODONE  (ROXICODONE ) 5 MG IMMEDIATE RELEASE TABLET    Take 1 tablet (5 mg total) by mouth every 6 (six) hours as needed for up to 8 doses for severe pain (pain score 7-10).  Modified Medications   No medications on file  Discontinued Medications   No medications on file    Allergies: Allergies[1]  Past Medical History: No past medical history on file.  Social History: Social History   Socioeconomic History   Marital status: Married    Spouse name: Not on file   Number of children: Not on file   Years of education: Not on file   Highest education level: Not on file  Occupational History   Not on file  Tobacco Use   Smoking status: Never   Smokeless tobacco: Never  Vaping Use   Vaping status: Never Used  Substance and Sexual Activity   Alcohol  use: No   Drug use: No   Sexual activity: Not on file  Other Topics Concern   Not on file  Social History Narrative   Not on file   Social Drivers of Health   Tobacco Use: Low Risk (07/04/2023)   Patient History    Smoking  Tobacco Use: Never    Smokeless Tobacco Use: Never    Passive Exposure: Not on file  Financial Resource Strain: Not on file  Food Insecurity: Not on file  Transportation Needs: Not on file  Physical Activity: Not on file  Stress: Not on file  Social Connections: Not on file  Depression (PHQ2-9): Not on file  Alcohol  Screen: Not on file  Housing: Not on file  Utilities: Not on file  Health Literacy: Not on file    Labs: Lab Results  Component Value Date   HIV1RNAQUANT NOT DETECTED 04/15/2024   HIV1RNAQUANT NOT DETECTED 02/11/2024   HIV1RNAQUANT NOT DETECTED 12/11/2023    RPR and STI Lab Results  Component Value Date   LABRPR NON-REACTIVE 04/15/2024   LABRPR NON-REACTIVE 02/11/2024   LABRPR NON-REACTIVE 12/11/2023   LABRPR NON-REACTIVE 10/16/2023   LABRPR NON-REACTIVE 08/20/2023    STI Results GC GC CT CT  04/15/2024  2:07 PM Negative    Negative    Negative   Negative    Negative    Negative    02/11/2024  1:55 PM Negative    Negative    Negative   Negative    Negative    Negative    04/17/2023  1:42 PM Negative  Negative    Negative   Negative    Negative    Negative    02/14/2023  1:59 PM Negative    Negative    Negative   Negative    Negative    Negative    10/11/2022  2:52 PM Negative    Negative    Negative   Negative    Negative    Positive    06/12/2022  2:39 PM Negative   Negative    10/19/2021 11:15 AM Negative   Negative    06/13/2021 11:22 AM Negative   Negative    06/13/2021 11:12 AM Negative    Negative   Negative    Negative    02/16/2021 10:59 AM Negative   Negative    10/12/2016  9:35 AM  NOT DETECTED   NOT DETECTED     Hepatitis B Lab Results  Component Value Date   HEPBSAB POS (A) 10/12/2016   HEPBSAG NEGATIVE 10/04/2016   HEPBCAB NON REACTIVE 10/12/2016   Hepatitis C Lab Results  Component Value Date   HEPCAB NON-REACTIVE 12/09/2020   Hepatitis A Lab Results  Component Value Date   HAV REACTIVE (A)  10/11/2022   Lipids: Lab Results  Component Value Date   CHOL 140 10/16/2018   TRIG 76 10/16/2018   HDL 43 10/16/2018   CHOLHDL 3.3 10/16/2018   VLDL 13 10/12/2016   LDLCALC 81 10/16/2018    TARGET DATE: The 24th of the month  Assessment: Selestino presents today for their Apretude  injection and to follow up for HIV PrEP. No issues with past injections.  Screened patient for acute HIV symptoms such as fatigue, muscle aches, rash, sore throat, lymphadenopathy, headache, night sweats, nausea/vomiting/diarrhea, and fever. Patient denies any symptoms.   Administered cabotegravir  600mg /14mL in left upper outer quadrant of the gluteal muscle. Will make follow up appointments for maintenance injections every 2 months.   No known exposures to any STIs and no signs or symptoms of any STIs today. Last STI screening was in October and was negative. No new partners since last injection; will check full STI testing today.   Discussed [place] for PrEP. Counseled patient that they will need to complete an oral loading dose. Counseled that patient will take two Sunlenca 300 mg tablets (600 mg total) orally on the first day of their injection and will take two Sunlenca 300 mg tablets (600 mg total) orally the next day regardless of meals. Counseled patient that Sunlenca is two separate subcutaneous injections in the abdomen every 6 months. Reviewed that the main side effects are injection-site soreness and nodules. Discussed measures for relief including cold packs and over-the-counter anti-inflammatories. He would like to continue on Apretude .   Plan: - Administer Apretude  600 mg x 1  - Maintenance injections scheduled for 08/12/24 with [name]  - Check HIV RNA, RPR and urine/oral/rectal cytologies - Call with any issues or questions  Alan Geralds, PharmD, CPP, BCIDP, AAHIVP Clinical Pharmacist Practitioner Infectious Diseases Clinical Pharmacist [facility]     [1]   Allergies Allergen Reactions   Doxycycline  Other (See Comments)    Fixed Drug Reaction

## 2024-06-11 ENCOUNTER — Ambulatory Visit: Admitting: Pharmacist

## 2024-06-12 ENCOUNTER — Ambulatory Visit: Admitting: Pharmacist

## 2024-06-12 ENCOUNTER — Other Ambulatory Visit (HOSPITAL_COMMUNITY)
Admission: RE | Admit: 2024-06-12 | Discharge: 2024-06-12 | Disposition: A | Source: Ambulatory Visit | Attending: Infectious Disease | Admitting: Infectious Disease

## 2024-06-12 ENCOUNTER — Other Ambulatory Visit: Payer: Self-pay

## 2024-06-12 DIAGNOSIS — Z113 Encounter for screening for infections with a predominantly sexual mode of transmission: Secondary | ICD-10-CM

## 2024-06-12 DIAGNOSIS — Z2981 Encounter for HIV pre-exposure prophylaxis: Secondary | ICD-10-CM

## 2024-06-12 MED ORDER — CABOTEGRAVIR ER 600 MG/3ML IM SUER
600.0000 mg | Freq: Once | INTRAMUSCULAR | Status: AC
  Administered 2024-06-12: 13:00:00 600 mg via INTRAMUSCULAR

## 2024-06-13 LAB — CYTOLOGY, (ORAL, ANAL, URETHRAL) ANCILLARY ONLY
Chlamydia: NEGATIVE
Chlamydia: NEGATIVE
Comment: NEGATIVE
Comment: NEGATIVE
Comment: NORMAL
Comment: NORMAL
Neisseria Gonorrhea: NEGATIVE
Neisseria Gonorrhea: NEGATIVE

## 2024-06-13 LAB — URINE CYTOLOGY ANCILLARY ONLY
Chlamydia: NEGATIVE
Comment: NEGATIVE
Comment: NORMAL
Neisseria Gonorrhea: NEGATIVE

## 2024-06-14 LAB — HIV-1 RNA QUANT-NO REFLEX-BLD
HIV 1 RNA Quant: NOT DETECTED {copies}/mL
HIV-1 RNA Quant, Log: NOT DETECTED {Log_copies}/mL

## 2024-06-14 LAB — SYPHILIS: RPR W/REFLEX TO RPR TITER AND TREPONEMAL ANTIBODIES, TRADITIONAL SCREENING AND DIAGNOSIS ALGORITHM: RPR Ser Ql: NONREACTIVE

## 2024-07-23 ENCOUNTER — Other Ambulatory Visit (HOSPITAL_COMMUNITY): Payer: Self-pay

## 2024-07-28 ENCOUNTER — Other Ambulatory Visit (HOSPITAL_COMMUNITY): Payer: Self-pay

## 2024-07-28 ENCOUNTER — Other Ambulatory Visit: Payer: Self-pay

## 2024-07-28 ENCOUNTER — Other Ambulatory Visit: Payer: Self-pay | Admitting: Pharmacist

## 2024-07-28 DIAGNOSIS — Z2981 Encounter for HIV pre-exposure prophylaxis: Secondary | ICD-10-CM

## 2024-07-28 MED ORDER — APRETUDE 600 MG/3ML IM SUER
600.0000 mg | INTRAMUSCULAR | 5 refills | Status: AC
  Filled 2024-07-28: qty 3, 60d supply, fill #0

## 2024-07-28 NOTE — Progress Notes (Signed)
 Specialty Pharmacy Refill Coordination Note  Michael Duncan is a 44 y.o. male assessed today regarding refills of clinic administered specialty medication(s) Cabotegravir  (Apretude )   Clinic requested Courier to Provider Office   Delivery date: 08/05/24   Verified address: [address] [place] [address]   Medication will be filled on 08/04/24.

## 2024-08-12 ENCOUNTER — Ambulatory Visit: Payer: Self-pay | Admitting: Pharmacist
